# Patient Record
Sex: Female | Born: 1980
Health system: Southern US, Community
[De-identification: ages and names within clinical notes are randomized; demographics above are authoritative.]

## PROBLEM LIST (undated history)

## (undated) DIAGNOSIS — I10 Essential (primary) hypertension: Secondary | ICD-10-CM

## (undated) DIAGNOSIS — Z720 Tobacco use: Secondary | ICD-10-CM

## (undated) DIAGNOSIS — I509 Heart failure, unspecified: Secondary | ICD-10-CM

## (undated) HISTORY — DX: Heart failure, unspecified: I50.9

## (undated) HISTORY — DX: Essential (primary) hypertension: I10

## (undated) HISTORY — PX: TUBAL LIGATION: SHX77

## (undated) HISTORY — PX: CARDIAC CATHETERIZATION: SHX172

## (undated) HISTORY — PX: CHOLECYSTECTOMY: SHX55

---

## 2006-01-14 ENCOUNTER — Emergency Department (HOSPITAL_COMMUNITY): Admission: EM | Admit: 2006-01-14 | Discharge: 2006-01-14 | Payer: Self-pay | Admitting: Emergency Medicine

## 2006-02-19 ENCOUNTER — Emergency Department (HOSPITAL_COMMUNITY): Admission: EM | Admit: 2006-02-19 | Discharge: 2006-02-19 | Payer: Self-pay | Admitting: Emergency Medicine

## 2006-06-15 ENCOUNTER — Inpatient Hospital Stay (HOSPITAL_COMMUNITY): Admission: AD | Admit: 2006-06-15 | Discharge: 2006-06-15 | Payer: Self-pay | Admitting: Gynecology

## 2008-05-29 ENCOUNTER — Emergency Department (HOSPITAL_COMMUNITY): Admission: EM | Admit: 2008-05-29 | Discharge: 2008-05-29 | Payer: Self-pay | Admitting: Emergency Medicine

## 2016-09-21 ENCOUNTER — Encounter (INDEPENDENT_AMBULATORY_CARE_PROVIDER_SITE_OTHER): Payer: Self-pay

## 2020-02-29 ENCOUNTER — Encounter (HOSPITAL_COMMUNITY): Payer: Self-pay | Admitting: Emergency Medicine

## 2020-02-29 ENCOUNTER — Emergency Department (HOSPITAL_COMMUNITY)
Admission: EM | Admit: 2020-02-29 | Discharge: 2020-02-29 | Disposition: A | Discharge status: Home / Self Care | Payer: Self-pay | Attending: Emergency Medicine | Admitting: Emergency Medicine

## 2020-02-29 DIAGNOSIS — B86 Scabies: Secondary | ICD-10-CM | POA: Insufficient documentation

## 2020-02-29 DIAGNOSIS — R21 Rash and other nonspecific skin eruption: Secondary | ICD-10-CM

## 2020-02-29 MED ORDER — HYDROXYZINE HCL 25 MG PO TABS: 25.0000 mg | ORAL_TABLET | Freq: Four times a day (QID) | ORAL | 0 refills | Status: DC

## 2020-02-29 MED ORDER — PERMETHRIN 5 % EX CREA: TOPICAL_CREAM | CUTANEOUS | 1 refills | Status: DC

## 2020-02-29 NOTE — ED Triage Notes (Signed)
Patient in POV. Presents with rash and burning sensation to bilateral hands X1 weeks. States she has used hydrocortisone cream with no relief.

## 2020-02-29 NOTE — ED Provider Notes (Signed)
[facility] EMERGENCY DEPARTMENT Provider Note   CSN: [id number] Arrival date & time: 02/29/20  1711     History Chief Complaint  Patient presents with  . Rash    Isabella Sanders is a 39 y.o. female with no pertinent past medical history presents today for evaluation of pruritic rash on her bilateral hands and arms.  Rash has been present for about 1 week.  She has been trying hydrocortisone cream at home without significant relief.  She denies any fevers.  She states no one else at home has a similar rash.  She has never had a rash like this before.  She states that she has been exposed to new gloves and cleaners at work recently.  She states that it is more itchy at night.  She denies any fevers.  No known tick bites or other lesions.  She denies any new medications.  The rash itches the mostly between her fingers.  HPI     History reviewed. No pertinent past medical history.  There are no problems to display for this patient.   Past Surgical History:  Procedure Laterality Date  . CESAREAN SECTION       OB History   No obstetric history on file.     No family history on file.  Social History   Tobacco Use  . Smoking status: Never Smoker  . Smokeless tobacco: Never Used  Substance Use Topics  . Alcohol use: Yes    Comment: Socially   . Drug use: Not Currently    Home Medications Prior to Admission medications   Medication Sig Start Date End Date Taking? Authorizing Provider  hydrOXYzine (ATARAX/VISTARIL) 25 MG tablet Take 1 tablet (25 mg total) by mouth every 6 (six) hours. 02/29/20   Cristina Gong, PA-C  permethrin (ELIMITE) 5 % cream Apply to entire body from the neck down.  Leave on for 8-14 hours then wash off. Repeat in one week. 02/29/20   Cristina Gong, PA-C    Allergies    Patient has no known allergies.  Review of Systems   Review of Systems  Constitutional: Negative for chills and fever.  Respiratory: Negative for  shortness of breath.   Skin: Positive for rash. Negative for wound.  Allergic/Immunologic: Negative for immunocompromised state.  Neurological: Negative for weakness and headaches.  All other systems reviewed and are negative.   Physical Exam Updated Vital Signs BP (!) 157/109 (BP Location: Left Arm)   Pulse 86   Temp 98.8 F (37.1 C) (Oral)   Resp 15   Ht 5\' 6"  (1.676 m)   Wt 77.1 kg   SpO2 100%   BMI 27.44 kg/m   Physical Exam Vitals and nursing note reviewed.  Constitutional:      General: She is not in acute distress.    Appearance: She is not ill-appearing.  HENT:     Head: Normocephalic.  Cardiovascular:     Rate and Rhythm: Normal rate.  Pulmonary:     Effort: Pulmonary effort is normal. No respiratory distress.  Skin:    Comments: Extensive papular rash present over the bilateral hands with decreasing frequency up into the forearms.  There are multiple lesions in the intertriginous areas of the bilateral hands.  Secondary excoriation without evidence of secondary infection  Neurological:     Mental Status: She is alert.     Comments: Patient is awake and alert, answers questions appropriately without slurred speech or difficulty  ED Results / Procedures / Treatments   Labs (all labs ordered are listed, but only abnormal results are displayed) Labs Reviewed - No data to display  EKG None  Radiology No results found.  Procedures Procedures (including critical care time)  Medications Ordered in ED Medications - No data to display  ED Course  I have reviewed the triage vital signs and the nursing notes.  Pertinent labs & imaging results that were available during my care of the patient were reviewed by me and considered in my medical decision making (see chart for details).    MDM Rules/Calculators/A&P                     Patient is a 39 year old woman who presents today for evaluation of a pruritic rash on her bilateral hands now extending up  into her forearms worsening over the past week despite hydrocortisone ointment at home.  On exam she has multiple skin colored papules clustered primarily in in the intertriginous/webspaces of the fingers.  Clinically exam appears to be consistent with scabies infestation.  We discussed conservative care at home including the need to appropriately monitor and treat clothing, bed, furniture etc. and that any close contacts will also need to be treated for eradication of the mite.    She is given a prescription for permethrin cream with instructions for repeat dose in 1 week.  She is given a prescription for Atarax for itching.  Return precautions were discussed with patient who states their understanding.  At the time of discharge patient denied any unaddressed complaints or concerns.  Patient is agreeable for discharge home.  Note: Portions of this report may have been transcribed using voice recognition software. Every effort was made to ensure accuracy; however, inadvertent computerized transcription errors may be present  We discussed that scabies is very contagious and the need to ensure she does not infect others.   Final Clinical Impression(s) / ED Diagnoses Final diagnoses:  Rash and nonspecific skin eruption  Scabies    Rx / DC Orders ED Discharge Orders         Ordered    permethrin (ELIMITE) 5 % cream     02/29/20 1955    hydrOXYzine (ATARAX/VISTARIL) 25 MG tablet  Every 6 hours     02/29/20 1959           Ollen Gross 02/29/20 2125    Lennice Sites, DO 03/01/20 0010

## 2020-02-29 NOTE — Discharge Instructions (Signed)
Please use the prescription cream.  Please apply it to your entire body from the top of the neck down and leave on for 8 to 14 hours.  After that please shower and wash it off.  Recommend repeating this process 1 week after your first treatment.  If your rash does not start to improve in the next 5 days then you need to be reseen to evaluate for other causes.  Please stop using hydrocortisone cream.  I have given you a prescription for Atarax to help with itching.  This medicine can make you drowsy and you should not drive while taking it, please see full details below.  Additionally environmental control measures such as washing all bedding, sheets, and clothing in hot water is required to resolve a scabies infestation.  Any other household members or close contacts may also have scabies and may need to get treated.  The CDC website is a good resource for reference on scabies.  You are being prescribed a medication which may make you sleepy. For 24 hours after one dose please do not drive, operate heavy machinery, care for a small child with out another adult present, or perform any activities that may cause harm to you or someone else if you were to fall asleep or be impaired.   While in the ED your blood pressure was high.  Please follow up with your primary care doctor or the wellness clinic for repeat evaluation as you may need medication.  High blood pressure can cause long term, potentially serious, damage if left untreated.

## 2020-02-29 NOTE — ED Notes (Signed)
Pt verbalized understanding of discharge instructions. Follow up care, prescriptions reviewed. Pt ambulated independently to lobby.

## 2020-02-29 NOTE — ED Notes (Signed)
Pt reports rash to bilateral hands and forearms that started Monday on hands and has progressed up. States Tuesday it started tingling and yesterday it started burning. Pt used hydrocortisone cream yesterday, states it helped "stopped it spreading" but it still burns and itches.

## 2020-08-19 ENCOUNTER — Other Ambulatory Visit: Payer: Self-pay

## 2020-08-19 ENCOUNTER — Encounter (HOSPITAL_COMMUNITY): Payer: Self-pay | Admitting: Emergency Medicine

## 2020-08-19 ENCOUNTER — Inpatient Hospital Stay (HOSPITAL_COMMUNITY)
Admission: EM | Admit: 2020-08-19 | Discharge: 2020-08-23 | DRG: 286 | Disposition: A | Discharge status: Home / Self Care | Payer: Self-pay | Attending: Family Medicine | Admitting: Family Medicine

## 2020-08-19 DIAGNOSIS — I5040 Unspecified combined systolic (congestive) and diastolic (congestive) heart failure: Secondary | ICD-10-CM

## 2020-08-19 DIAGNOSIS — W19XXXA Unspecified fall, initial encounter: Secondary | ICD-10-CM

## 2020-08-19 DIAGNOSIS — K7460 Unspecified cirrhosis of liver: Secondary | ICD-10-CM

## 2020-08-19 DIAGNOSIS — R791 Abnormal coagulation profile: Secondary | ICD-10-CM | POA: Diagnosis present

## 2020-08-19 DIAGNOSIS — I447 Left bundle-branch block, unspecified: Secondary | ICD-10-CM | POA: Diagnosis present

## 2020-08-19 DIAGNOSIS — I428 Other cardiomyopathies: Secondary | ICD-10-CM | POA: Diagnosis present

## 2020-08-19 DIAGNOSIS — N920 Excessive and frequent menstruation with regular cycle: Secondary | ICD-10-CM | POA: Diagnosis present

## 2020-08-19 DIAGNOSIS — F17210 Nicotine dependence, cigarettes, uncomplicated: Secondary | ICD-10-CM | POA: Diagnosis present

## 2020-08-19 DIAGNOSIS — Z8241 Family history of sudden cardiac death: Secondary | ICD-10-CM

## 2020-08-19 DIAGNOSIS — Z20822 Contact with and (suspected) exposure to covid-19: Secondary | ICD-10-CM | POA: Diagnosis present

## 2020-08-19 DIAGNOSIS — Z8249 Family history of ischemic heart disease and other diseases of the circulatory system: Secondary | ICD-10-CM

## 2020-08-19 DIAGNOSIS — I10 Essential (primary) hypertension: Secondary | ICD-10-CM

## 2020-08-19 DIAGNOSIS — D500 Iron deficiency anemia secondary to blood loss (chronic): Secondary | ICD-10-CM | POA: Diagnosis present

## 2020-08-19 DIAGNOSIS — I509 Heart failure, unspecified: Secondary | ICD-10-CM

## 2020-08-19 DIAGNOSIS — M7989 Other specified soft tissue disorders: Secondary | ICD-10-CM

## 2020-08-19 DIAGNOSIS — D509 Iron deficiency anemia, unspecified: Secondary | ICD-10-CM | POA: Diagnosis present

## 2020-08-19 DIAGNOSIS — E785 Hyperlipidemia, unspecified: Secondary | ICD-10-CM | POA: Diagnosis present

## 2020-08-19 DIAGNOSIS — D649 Anemia, unspecified: Secondary | ICD-10-CM

## 2020-08-19 DIAGNOSIS — I5021 Acute systolic (congestive) heart failure: Secondary | ICD-10-CM

## 2020-08-19 DIAGNOSIS — I5041 Acute combined systolic (congestive) and diastolic (congestive) heart failure: Secondary | ICD-10-CM | POA: Diagnosis present

## 2020-08-19 DIAGNOSIS — E876 Hypokalemia: Secondary | ICD-10-CM | POA: Diagnosis present

## 2020-08-19 DIAGNOSIS — F1010 Alcohol abuse, uncomplicated: Secondary | ICD-10-CM | POA: Diagnosis present

## 2020-08-19 DIAGNOSIS — F1011 Alcohol abuse, in remission: Secondary | ICD-10-CM | POA: Diagnosis present

## 2020-08-19 DIAGNOSIS — Z832 Family history of diseases of the blood and blood-forming organs and certain disorders involving the immune mechanism: Secondary | ICD-10-CM

## 2020-08-19 DIAGNOSIS — Z720 Tobacco use: Secondary | ICD-10-CM | POA: Diagnosis present

## 2020-08-19 DIAGNOSIS — I110 Hypertensive heart disease with heart failure: Principal | ICD-10-CM | POA: Diagnosis present

## 2020-08-19 HISTORY — DX: Tobacco use: Z72.0

## 2020-08-19 LAB — CBC WITH DIFFERENTIAL/PLATELET
Hemoglobin: 7.7 g/dL — ABNORMAL LOW (ref 12.0–15.0)
Platelets: 352 10*3/uL (ref 150–400)
RBC: 3.79 MIL/uL — ABNORMAL LOW (ref 3.87–5.11)
WBC: 4.4 10*3/uL (ref 4.0–10.5)

## 2020-08-19 NOTE — ED Triage Notes (Signed)
Pt to ED with c/o bil feet and leg swelling xs 2 days.  C/O pain in ankles

## 2020-08-20 ENCOUNTER — Inpatient Hospital Stay (HOSPITAL_COMMUNITY): Payer: Self-pay

## 2020-08-20 ENCOUNTER — Encounter (HOSPITAL_COMMUNITY): Payer: Self-pay | Admitting: Internal Medicine

## 2020-08-20 ENCOUNTER — Emergency Department (HOSPITAL_COMMUNITY): Payer: Self-pay

## 2020-08-20 ENCOUNTER — Other Ambulatory Visit: Payer: Self-pay

## 2020-08-20 DIAGNOSIS — F1011 Alcohol abuse, in remission: Secondary | ICD-10-CM | POA: Diagnosis present

## 2020-08-20 DIAGNOSIS — I361 Nonrheumatic tricuspid (valve) insufficiency: Secondary | ICD-10-CM

## 2020-08-20 DIAGNOSIS — F1010 Alcohol abuse, uncomplicated: Secondary | ICD-10-CM | POA: Diagnosis present

## 2020-08-20 DIAGNOSIS — I340 Nonrheumatic mitral (valve) insufficiency: Secondary | ICD-10-CM

## 2020-08-20 DIAGNOSIS — R0602 Shortness of breath: Secondary | ICD-10-CM

## 2020-08-20 DIAGNOSIS — I5041 Acute combined systolic (congestive) and diastolic (congestive) heart failure: Secondary | ICD-10-CM

## 2020-08-20 DIAGNOSIS — I509 Heart failure, unspecified: Secondary | ICD-10-CM

## 2020-08-20 DIAGNOSIS — I5040 Unspecified combined systolic (congestive) and diastolic (congestive) heart failure: Secondary | ICD-10-CM

## 2020-08-20 DIAGNOSIS — Z720 Tobacco use: Secondary | ICD-10-CM

## 2020-08-20 DIAGNOSIS — D509 Iron deficiency anemia, unspecified: Secondary | ICD-10-CM | POA: Diagnosis present

## 2020-08-20 DIAGNOSIS — I10 Essential (primary) hypertension: Secondary | ICD-10-CM

## 2020-08-20 DIAGNOSIS — R609 Edema, unspecified: Secondary | ICD-10-CM

## 2020-08-20 DIAGNOSIS — E876 Hypokalemia: Secondary | ICD-10-CM

## 2020-08-20 LAB — IRON AND TIBC
Iron: 20 ug/dL — ABNORMAL LOW (ref 28–170)
Saturation Ratios: 5 % — ABNORMAL LOW (ref 10.4–31.8)
TIBC: 419 ug/dL (ref 250–450)
UIBC: 399 ug/dL

## 2020-08-20 LAB — ECHOCARDIOGRAM COMPLETE
Area-P 1/2: 4.18 cm2
Height: 66 in
MV M vel: 5.58 m/s
MV Peak grad: 124.5 mmHg
Radius: 0.5 cm
S' Lateral: 5.82 cm
Weight: 2848 oz

## 2020-08-20 LAB — COMPREHENSIVE METABOLIC PANEL
ALT: 24 U/L (ref 0–44)
AST: 24 U/L (ref 15–41)
Albumin: 3.1 g/dL — ABNORMAL LOW (ref 3.5–5.0)
Alkaline Phosphatase: 80 U/L (ref 38–126)
Anion gap: 7 (ref 5–15)
BUN: 11 mg/dL (ref 6–20)
CO2: 26 mmol/L (ref 22–32)
Calcium: 8.6 mg/dL — ABNORMAL LOW (ref 8.9–10.3)
Chloride: 104 mmol/L (ref 98–111)
Creatinine, Ser: 0.71 mg/dL (ref 0.44–1.00)
GFR, Estimated: >60 mL/min (ref >60)
Glucose, Bld: 119 mg/dL — ABNORMAL HIGH (ref 70–99)
Potassium: 3.3 mmol/L — ABNORMAL LOW (ref 3.5–5.1)
Sodium: 137 mmol/L (ref 135–145)
Total Bilirubin: 0.5 mg/dL (ref 0.3–1.2)
Total Protein: 6.1 g/dL — ABNORMAL LOW (ref 6.5–8.1)

## 2020-08-20 LAB — CBC WITH DIFFERENTIAL/PLATELET
Basophils Absolute: 0 10*3/uL (ref 0.0–0.1)
Basophils Relative: 1 %
Eosinophils Absolute: 0 10*3/uL (ref 0.0–0.5)
Eosinophils Relative: 1 %
HCT: 26.8 % — ABNORMAL LOW (ref 36.0–46.0)
Immature Granulocytes: 0 %
Lymphocytes Relative: 23 %
Lymphs Abs: 1 10*3/uL (ref 0.7–4.0)
MCH: 20.3 pg — ABNORMAL LOW (ref 26.0–34.0)
MCHC: 28.7 g/dL — ABNORMAL LOW (ref 30.0–36.0)
MCV: 70.7 fL — ABNORMAL LOW (ref 80.0–100.0)
Monocytes Absolute: 0.4 10*3/uL (ref 0.1–1.0)
Monocytes Relative: 10 %
Neutro Abs: 2.9 10*3/uL (ref 1.7–7.7)
Neutrophils Relative %: 65 %
RDW: 20.9 % — ABNORMAL HIGH (ref 11.5–15.5)
nRBC: 0 % (ref 0.0–0.2)

## 2020-08-20 LAB — TROPONIN I (HIGH SENSITIVITY)
Troponin I (High Sensitivity): 16 ng/L (ref <18)
Troponin I (High Sensitivity): 21 ng/L — ABNORMAL HIGH (ref <18)

## 2020-08-20 LAB — PROTIME-INR
INR: 1.3 — ABNORMAL HIGH (ref 0.8–1.2)
Prothrombin Time: 15.7 seconds — ABNORMAL HIGH (ref 11.4–15.2)

## 2020-08-20 LAB — TYPE AND SCREEN
ABO/RH(D): O POS
Antibody Screen: NEGATIVE

## 2020-08-20 LAB — RESPIRATORY PANEL BY RT PCR (FLU A&B, COVID)
Influenza A by PCR: NEGATIVE
Influenza B by PCR: NEGATIVE
SARS Coronavirus 2 by RT PCR: NEGATIVE

## 2020-08-20 LAB — TSH: TSH: 2.161 u[IU]/mL (ref 0.350–4.500)

## 2020-08-20 LAB — FERRITIN: Ferritin: 7 ng/mL — ABNORMAL LOW (ref 11–307)

## 2020-08-20 LAB — MAGNESIUM: Magnesium: 1.8 mg/dL (ref 1.7–2.4)

## 2020-08-20 LAB — HIV ANTIBODY (ROUTINE TESTING W REFLEX): HIV Screen 4th Generation wRfx: NONREACTIVE

## 2020-08-20 LAB — RETICULOCYTES
Immature Retic Fract: 21.4 % — ABNORMAL HIGH (ref 2.3–15.9)
RBC.: 3.8 MIL/uL — ABNORMAL LOW (ref 3.87–5.11)
Retic Count, Absolute: 66.9 10*3/uL (ref 19.0–186.0)
Retic Ct Pct: 1.8 % (ref 0.4–3.1)

## 2020-08-20 LAB — I-STAT BETA HCG BLOOD, ED (MC, WL, AP ONLY): I-stat hCG, quantitative: <5 m[IU]/mL (ref <5)

## 2020-08-20 LAB — FOLATE: Folate: 8.3 ng/mL (ref >5.9)

## 2020-08-20 LAB — VITAMIN B12: Vitamin B-12: 460 pg/mL (ref 180–914)

## 2020-08-20 LAB — ETHANOL: Alcohol, Ethyl (B): <10 mg/dL (ref <10)

## 2020-08-20 LAB — ABO/RH: ABO/RH(D): O POS

## 2020-08-20 LAB — BRAIN NATRIURETIC PEPTIDE: B Natriuretic Peptide: 2726.1 pg/mL — ABNORMAL HIGH (ref 0.0–100.0)

## 2020-08-20 MED ORDER — POTASSIUM CHLORIDE CRYS ER 20 MEQ PO TBCR
40.0000 meq | EXTENDED_RELEASE_TABLET | Freq: Once | ORAL | Status: AC
  Administered 2020-08-20: 40 meq via ORAL
  Filled 2020-08-20: qty 2

## 2020-08-20 MED ORDER — LORAZEPAM 1 MG PO TABS: 1.0000 mg | ORAL_TABLET | ORAL | Status: AC | PRN

## 2020-08-20 MED ORDER — LORAZEPAM 1 MG PO TABS: 0.0000 mg | ORAL_TABLET | Freq: Two times a day (BID) | ORAL | Status: DC

## 2020-08-20 MED ORDER — SACUBITRIL-VALSARTAN 49-51 MG PO TABS
1.0000 | ORAL_TABLET | Freq: Two times a day (BID) | ORAL | Status: DC
  Administered 2020-08-21 – 2020-08-23 (×5): 1 via ORAL
  Filled 2020-08-20 (×5): qty 1

## 2020-08-20 MED ORDER — FUROSEMIDE 10 MG/ML IJ SOLN
40.0000 mg | Freq: Two times a day (BID) | INTRAMUSCULAR | Status: DC
  Administered 2020-08-20: 40 mg via INTRAVENOUS
  Filled 2020-08-20: qty 4

## 2020-08-20 MED ORDER — FUROSEMIDE 10 MG/ML IJ SOLN
80.0000 mg | Freq: Two times a day (BID) | INTRAMUSCULAR | Status: DC
  Filled 2020-08-20: qty 8

## 2020-08-20 MED ORDER — CARVEDILOL 12.5 MG PO TABS
12.5000 mg | ORAL_TABLET | Freq: Two times a day (BID) | ORAL | Status: DC
  Administered 2020-08-20 – 2020-08-22 (×4): 12.5 mg via ORAL
  Filled 2020-08-20 (×4): qty 1

## 2020-08-20 MED ORDER — ADULT MULTIVITAMIN W/MINERALS CH
1.0000 | ORAL_TABLET | Freq: Every day | ORAL | Status: DC
  Administered 2020-08-20 – 2020-08-23 (×3): 1 via ORAL
  Filled 2020-08-20 (×3): qty 1

## 2020-08-20 MED ORDER — ENSURE ENLIVE PO LIQD
237.0000 mL | Freq: Two times a day (BID) | ORAL | Status: DC
  Administered 2020-08-21: 237 mL via ORAL

## 2020-08-20 MED ORDER — LORAZEPAM 1 MG PO TABS: 0.0000 mg | ORAL_TABLET | Freq: Four times a day (QID) | ORAL | Status: AC

## 2020-08-20 MED ORDER — POTASSIUM CHLORIDE CRYS ER 20 MEQ PO TBCR
40.0000 meq | EXTENDED_RELEASE_TABLET | Freq: Two times a day (BID) | ORAL | Status: DC
  Administered 2020-08-20 – 2020-08-22 (×5): 40 meq via ORAL
  Filled 2020-08-20 (×5): qty 2

## 2020-08-20 MED ORDER — THIAMINE HCL 100 MG PO TABS
100.0000 mg | ORAL_TABLET | Freq: Every day | ORAL | Status: DC
  Administered 2020-08-20 – 2020-08-23 (×3): 100 mg via ORAL
  Filled 2020-08-20 (×4): qty 1

## 2020-08-20 MED ORDER — SODIUM CHLORIDE 0.9% FLUSH
3.0000 mL | Freq: Two times a day (BID) | INTRAVENOUS | Status: DC
  Administered 2020-08-20 – 2020-08-21 (×2): 3 mL via INTRAVENOUS

## 2020-08-20 MED ORDER — FOLIC ACID 1 MG PO TABS
1.0000 mg | ORAL_TABLET | Freq: Every day | ORAL | Status: DC
  Administered 2020-08-20 – 2020-08-23 (×4): 1 mg via ORAL
  Filled 2020-08-20 (×4): qty 1

## 2020-08-20 MED ORDER — LOSARTAN POTASSIUM 50 MG PO TABS
50.0000 mg | ORAL_TABLET | Freq: Every day | ORAL | Status: DC
  Administered 2020-08-20: 50 mg via ORAL
  Filled 2020-08-20: qty 1

## 2020-08-20 MED ORDER — POTASSIUM CHLORIDE CRYS ER 20 MEQ PO TBCR
20.0000 meq | EXTENDED_RELEASE_TABLET | Freq: Once | ORAL | Status: AC
  Administered 2020-08-20: 20 meq via ORAL
  Filled 2020-08-20: qty 1

## 2020-08-20 MED ORDER — THIAMINE HCL 100 MG/ML IJ SOLN
100.0000 mg | Freq: Every day | INTRAMUSCULAR | Status: DC
  Administered 2020-08-21: 100 mg via INTRAVENOUS
  Filled 2020-08-20: qty 2

## 2020-08-20 MED ORDER — LORAZEPAM 2 MG/ML IJ SOLN: 1.0000 mg | INTRAMUSCULAR | Status: AC | PRN

## 2020-08-20 MED ORDER — HYDRALAZINE HCL 20 MG/ML IJ SOLN
10.0000 mg | INTRAMUSCULAR | Status: DC | PRN
  Filled 2020-08-20: qty 1

## 2020-08-20 NOTE — Plan of Care (Signed)
  Problem: Activity: Goal: Risk for activity intolerance will decrease Outcome: Progressing   Problem: Safety: Goal: Ability to remain free from injury will improve Outcome: Progressing   

## 2020-08-20 NOTE — H&P (Signed)
History and Physical    Isabella Sanders YOV:[id number] DOB: 04/03/1981 DOA: 08/19/2020  PCP: Patient, No Pcp Per  Patient coming from: Home.  Chief Complaint: Lower extremity edema.  HPI: Isabella Sanders is a 39 y.o. female with history of tobacco and alcohol abuse presents to the ER because of worsening lower extremity IMA noticed over the last couple of days.  Denies any shortness of breath but does have nonproductive cough which has been persistent for the last 2 weeks.  Has been taking over-the-counter Mucinex.  Denies fever chills nausea vomiting diarrhea admits to drinking alcohol every day also smokes tobacco.  Patient states her younger sister died at age 40 while in training in the [employer] from sudden cardiac death.  ED Course: In the ER on exam patient has 3+ lower extremity edema extending up to the thighs elevated JVD with chest x-ray showing marked enlarged cardiac silhouette concerning for pericardial effusion.  EKG shows LBBB with no old ones to compare.  Labs are significant for markedly elevated BNP of 2700 and with a microcytic hypochromic anemia with hemoglobin of 7.7 potassium of 3.3.  Covid test was negative.  Patient admitted for new onset CHF and further work-up for anemia.  Patient states she has heavy menstrual cycle except for the last month.  Denies noticed any blood in the stools.  Review of Systems: As per HPI, rest all negative.   History reviewed. No pertinent past medical history.  Past Surgical History:  Procedure Laterality Date  . CESAREAN SECTION    . CHOLECYSTECTOMY       reports that she has been smoking. She has never used smokeless tobacco. She reports current alcohol use. She reports previous drug use.  No Known Allergies  Family History  Problem Relation Age of Onset  . Sudden Cardiac Death Sister     Prior to Admission medications   Medication Sig Start Date End Date Taking? Authorizing Provider  guaiFENesin (MUCINEX) 600 MG 12 hr tablet  Take 600 mg by mouth 2 (two) times daily as needed.   Yes [provider]  naproxen sodium (ALEVE) 220 MG tablet Take 440 mg by mouth 2 (two) times daily as needed (pain).   Yes [provider]  hydrOXYzine (ATARAX/VISTARIL) 25 MG tablet Take 1 tablet (25 mg total) by mouth every 6 (six) hours. Patient not taking: Reported on 08/20/2020 02/29/20   Cristina Gong, PA-C    Physical Exam: Constitutional: Moderately built and nourished. Vitals:   08/20/20 0430 08/20/20 0500 08/20/20 0515 08/20/20 0529  BP: (!) 152/97 (!) 147/97 (!) 149/104 (!) 149/104  Pulse:    90  Resp: (!) 28 (!) 31 (!) 30 (!) 31  Temp:      TempSrc:      SpO2:    100%  Weight:      Height:       Eyes: Anicteric no pallor. ENMT: No discharge from the ears eyes nose or mouth. Neck: Elevated JVD no mass felt. Respiratory: No rhonchi or crepitations. Cardiovascular: S1-S2 heard. Abdomen: Soft nontender bowel sounds present. Musculoskeletal: Bilateral lower extremity edema extending up to thighs. Skin: Chronic skin changes patient states he was recently treated for scabies. Neurologic: Alert awake oriented to time place and person.  Moves all extremities. Psychiatric: Appears normal.  Normal affect.   Labs on Admission: I have personally reviewed following labs and imaging studies  CBC: Recent Labs  Lab 08/19/20 2327  WBC 4.4  NEUTROABS 2.9  HGB 7.7*  HCT 26.8*  MCV 70.7*  PLT 352   Basic Metabolic Panel: Recent Labs  Lab 08/19/20 2327  NA 137  K 3.3*  CL 104  CO2 26  GLUCOSE 119*  BUN 11  CREATININE 0.71  CALCIUM 8.6*   GFR: Estimated Creatinine Clearance: 100.2 mL/min (by C-G formula based on SCr of 0.71 mg/dL). Liver Function Tests: Recent Labs  Lab 08/19/20 2327  AST 24  ALT 24  ALKPHOS 80  BILITOT 0.5  PROT 6.1*  ALBUMIN 3.1*   No results for input(s): LIPASE, AMYLASE in the last 168 hours. No results for input(s): AMMONIA in the last 168  hours. Coagulation Profile: Recent Labs  Lab 08/20/20 0420  INR 1.3*   Cardiac Enzymes: No results for input(s): CKTOTAL, CKMB, CKMBINDEX, TROPONINI in the last 168 hours. BNP (last 3 results) No results for input(s): PROBNP in the last 8760 hours. HbA1C: No results for input(s): HGBA1C in the last 72 hours. CBG: No results for input(s): GLUCAP in the last 168 hours. Lipid Profile: No results for input(s): CHOL, HDL, LDLCALC, TRIG, CHOLHDL, LDLDIRECT in the last 72 hours. Thyroid Function Tests: No results for input(s): TSH, T4TOTAL, FREET4, T3FREE, THYROIDAB in the last 72 hours. Anemia Panel: No results for input(s): VITAMINB12, FOLATE, FERRITIN, TIBC, IRON, RETICCTPCT in the last 72 hours. Urine analysis: No results found for: COLORURINE, APPEARANCEUR, LABSPEC, PHURINE, GLUCOSEU, HGBUR, BILIRUBINUR, KETONESUR, PROTEINUR, UROBILINOGEN, NITRITE, LEUKOCYTESUR Sepsis Labs: @LABRCNTIP (procalcitonin:4,lacticidven:4) ) Recent Results (from the past 240 hour(s))  Respiratory Panel by RT PCR (Flu A&B, Covid) - Nasopharyngeal Swab     Status: None   Collection Time: 08/20/20  4:19 AM   Specimen: Nasopharyngeal Swab  Result Value Ref Range Status   SARS Coronavirus 2 by RT PCR NEGATIVE NEGATIVE Final    Comment: (NOTE) SARS-CoV-2 target nucleic acids are NOT DETECTED.  The SARS-CoV-2 RNA is generally detectable in upper respiratoy specimens during the acute phase of infection. The lowest concentration of SARS-CoV-2 viral copies this assay can detect is 131 copies/mL. A negative result does not preclude SARS-Cov-2 infection and should not be used as the sole basis for treatment or other patient management decisions. A negative result may occur with  improper specimen collection/handling, submission of specimen other than nasopharyngeal swab, presence of viral mutation(s) within the areas targeted by this assay, and inadequate number of viral copies (<131 copies/mL). A negative  result must be combined with clinical observations, patient history, and epidemiological information. The expected result is Negative.  Fact Sheet for Patients:  13/10/21  Fact Sheet for Healthcare Providers:  https://www.moore.com/  This test is no t yet approved or cleared by the https://www.young.biz/ FDA and  has been authorized for detection and/or diagnosis of SARS-CoV-2 by FDA under an Emergency Use Authorization (EUA). This EUA will remain  in effect (meaning this test can be used) for the duration of the COVID-19 declaration under Section 564(b)(1) of the Act, 21 U.S.C. section 360bbb-3(b)(1), unless the authorization is terminated or revoked sooner.     Influenza A by PCR NEGATIVE NEGATIVE Final   Influenza B by PCR NEGATIVE NEGATIVE Final    Comment: (NOTE) The Xpert Xpress SARS-CoV-2/FLU/RSV assay is intended as an aid in  the diagnosis of influenza from Nasopharyngeal swab specimens and  should not be used as a sole basis for treatment. Nasal washings and  aspirates are unacceptable for Xpert Xpress SARS-CoV-2/FLU/RSV  testing.  Fact Sheet for Patients: [place]  Fact Sheet for Healthcare Providers: https://www.moore.com/  This test is  not yet approved or cleared by the [place] and  has been authorized for detection and/or diagnosis of SARS-CoV-2 by  FDA under an Emergency Use Authorization (EUA). This EUA will remain  in effect (meaning this test can be used) for the duration of the  Covid-19 declaration under Section 564(b)(1) of the Act, 21  U.S.C. section 360bbb-3(b)(1), unless the authorization is  terminated or revoked. Performed at [facility], [address], [place], [place] [address]      Radiological Exams on Admission: DG Chest 2 View  Result Date: 08/20/2020 CLINICAL DATA:  Shortness of breath EXAM: CHEST - 2 VIEW COMPARISON:   None. FINDINGS: Marked cardiopericardial enlargement. Interstitial opacity with [employer]. No visible effusion or pneumothorax. Cholecystectomy clips. IMPRESSION: 1. CHF. 2. Marked cardiopericardial enlargement globular heart shape, pericardial effusion could be present. Electronically Signed   By: Marnee Spring M.D.   On: 08/20/2020 05:01    EKG: Independently reviewed.  Normal sinus rhythm LBBB.  Assessment/Plan Principal Problem:   Acute CHF (congestive heart failure) (HCC) Active Problems:   Microcytic hypochromic anemia   Alcohol abuse   Tobacco abuse    1. Acute CHF -patient symptoms are concerning for acute CHF.  Unknown EF.  2D echo has been ordered stat to check for any large pericardial effusion and also to assess the LV function.  Cardiology has been consulted.  At this time I have not placed patient on Lasix and to make sure there is no definite signs of any tamponade but clinically patient does not look to be in tamponade.  Follow intake output Daily weights and metabolic panel.  Check TSH.  Dopplers of the lower extremity. 2. Microcytic hypochromic anemia likely from menorrhagia.  Check anemia panel follow CBC.  Check stool for occult blood.  Likely contributing to patient's CHF. 3. Alcohol abuse could also be contributing to CHF.  On CIWA protocol advised by quitting. 4. Mildly elevated INR.  With history of alcohol abuse will check sonogram of the abdomen to make sure there is no cirrhosis. 5. Tobacco abuse advised about quitting. 6. Mild elevated blood pressure on admission.  Closely follow blood pressure trends.  As needed IV hydralazine for now. 7. History of sudden cardiac death in the family.  Patient's sister died at age 26.  Since patient has symptoms of CHF new onset with LBBB no old EKG to compare with markedly elevated cardiac silhouette will need close monitoring for any further worsening in inpatient status.   DVT prophylaxis: For now I kept patient on SCDs.   And to make sure there is no tamponade all aspects of effusion which may need procedure will avoid anticoagulation. Code Status: Full code. Family Communication: Patient's husband. Disposition Plan: Home. Consults called: Cardiology. Admission status: Inpatient.   Eduard Clos MD [facility] Pager [phone].  If 7PM-7AM, please contact night-coverage [email or web address] Password [email or web address]  08/20/2020, 6:20 AM

## 2020-08-20 NOTE — Progress Notes (Signed)
  ReDS Clip Diuretic Study Pt study # [id number]  Your patient has been enrolled in the ReDS Clip Diuretic Study  Has not received lasix yet - MD waiting for ECHO and cardiology input for rule out tamponade. SCr 0.71. No uop charted. Not on PTA diuretics.   Changes to prescribed diuretics recommended:  Start lasix 40 mg IV BID Provider contacted: Marjie Skiff, PA Recommendation was accepted by provider.    REDS Clip  READING= 51%  CHEST RULER = 31 Clip Station = C   Orthodema score = 2 Signs/Symptoms Score   Mild edema, no orthopnea 0 No congestion  Moderate edema, no orthopnea 1 Low-grade orthodema/congestion  Severe edema OR orthopnea 2   Moderate edema and orthopnea 3 High-grade orthodema/congestion  Severe edema AND orthopnea 4    Sharen Hones, PharmD, BCPS Phone [phone]  Please check [email or web address] for unit-specific pharmacist phone numbers

## 2020-08-20 NOTE — ED Provider Notes (Signed)
[facility] EMERGENCY DEPARTMENT Provider Note   CSN: [id number] Arrival date & time: 08/19/20  2259     History Chief Complaint  Patient presents with  . Bil Feet Swelling    Isabella Sanders is a 39 y.o. female.  HPI     This is a 39 year old female with no reported past medical history who presents with bilateral feet swelling.  Patient reports 2-day history of worsening leg and foot swelling.  She reports some tightness but no overt pain.  She has had some mild shortness of breath but no chest pain.  She states that time she has noticed that her abdomen will also swell and then improved.  No known history of heart failure or liver disease.  She does report that she drinks alcohol "every other day."  No known history of anemia.  She denies any recent fevers or illnesses.  She is fully vaccinated against COVID-19.  History reviewed. No pertinent past medical history.  Patient Active Problem List   Diagnosis Date Noted  . Acute CHF (congestive heart failure) (HCC) 08/20/2020  . Microcytic hypochromic anemia 08/20/2020  . Alcohol abuse 08/20/2020  . Tobacco abuse 08/20/2020    Past Surgical History:  Procedure Laterality Date  . CESAREAN SECTION    . CHOLECYSTECTOMY       OB History   No obstetric history on file.     Family History  Problem Relation Age of Onset  . Sudden Cardiac Death Sister     Social History   Tobacco Use  . Smoking status: Current Every Day Smoker  . Smokeless tobacco: Never Used  Substance Use Topics  . Alcohol use: Yes    Comment: everyday.  . Drug use: Not Currently    Home Medications Prior to Admission medications   Medication Sig Start Date End Date Taking? Authorizing Provider  guaiFENesin (MUCINEX) 600 MG 12 hr tablet Take 600 mg by mouth 2 (two) times daily as needed.   Yes [provider]  naproxen sodium (ALEVE) 220 MG tablet Take 440 mg by mouth 2 (two) times daily as needed (pain).   Yes  [provider]  hydrOXYzine (ATARAX/VISTARIL) 25 MG tablet Take 1 tablet (25 mg total) by mouth every 6 (six) hours. Patient not taking: Reported on 08/20/2020 02/29/20   Cristina Gong, PA-C    Allergies    Patient has no known allergies.  Review of Systems   Review of Systems  Constitutional: Negative for fever.  Respiratory: Positive for shortness of breath.   Cardiovascular: Positive for leg swelling. Negative for chest pain.  Gastrointestinal: Positive for abdominal distention. Negative for abdominal pain, nausea and vomiting.  Genitourinary: Positive for dysuria.  Neurological: Negative for numbness.  All other systems reviewed and are negative.   Physical Exam Updated Vital Signs BP (!) 149/104 (BP Location: Right Arm)   Pulse 90   Temp 98 F (36.7 C) (Oral)   Resp (!) 31   Ht 1.676 m (5\' 6" )   Wt 80.7 kg   LMP 07/19/2020   SpO2 100%   BMI 28.73 kg/m   Physical Exam Vitals and nursing note reviewed.  Constitutional:      Appearance: She is well-developed. She is not ill-appearing.  HENT:     Head: Normocephalic and atraumatic.     Nose: Nose normal.  Eyes:     Pupils: Pupils are equal, round, and reactive to light.     Comments: Slight yellowing of the eyes  Cardiovascular:     Rate and Rhythm: Normal rate and regular rhythm.     Heart sounds: Normal heart sounds.  Pulmonary:     Effort: Pulmonary effort is normal. No respiratory distress.     Breath sounds: No wheezing.  Abdominal:     General: Bowel sounds are normal.     Palpations: Abdomen is soft.     Tenderness: There is no abdominal tenderness. There is no guarding or rebound.  Musculoskeletal:     Cervical back: Neck supple.     Comments: 2+ pitting bilateral lower extremity edema to the knees  Skin:    General: Skin is warm and dry.  Neurological:     Mental Status: She is alert and oriented to person, place, and time.  Psychiatric:        Mood and Affect: Mood normal.      ED Results / Procedures / Treatments   Labs (all labs ordered are listed, but only abnormal results are displayed) Labs Reviewed  CBC WITH DIFFERENTIAL/PLATELET - Abnormal; Notable for the following components:      Result Value   RBC 3.79 (*)    Hemoglobin 7.7 (*)    HCT 26.8 (*)    MCV 70.7 (*)    MCH 20.3 (*)    MCHC 28.7 (*)    RDW 20.9 (*)    All other components within normal limits  COMPREHENSIVE METABOLIC PANEL - Abnormal; Notable for the following components:   Potassium 3.3 (*)    Glucose, Bld 119 (*)    Calcium 8.6 (*)    Total Protein 6.1 (*)    Albumin 3.1 (*)    All other components within normal limits  BRAIN NATRIURETIC PEPTIDE - Abnormal; Notable for the following components:   B Natriuretic Peptide 2,726.1 (*)    All other components within normal limits  PROTIME-INR - Abnormal; Notable for the following components:   Prothrombin Time 15.7 (*)    INR 1.3 (*)    All other components within normal limits  FERRITIN - Abnormal; Notable for the following components:   Ferritin 7 (*)    All other components within normal limits  RESPIRATORY PANEL BY RT PCR (FLU A&B, COVID)  ETHANOL  VITAMIN B12  TSH  IRON AND TIBC  FOLATE  HIV ANTIBODY (ROUTINE TESTING W REFLEX)  RAPID URINE DRUG SCREEN, HOSP PERFORMED  RETICULOCYTES  I-STAT BETA HCG BLOOD, ED ([facility], [facility], [facility] ONLY)  TYPE AND SCREEN    EKG EKG Interpretation  Date/Time:  Wednesday August 20 2020 04:21:21 EST Ventricular Rate:  85 PR Interval:    QRS Duration: 174 QT Interval:  454 QTC Calculation: 540 R Axis:   40 Text Interpretation: Sinus rhythm Left bundle branch block No prior for comparison Confirmed by Ross Marcus ([id number]) on 08/20/2020 5:40:58 AM   Radiology DG Chest 2 View  Result Date: 08/20/2020 CLINICAL DATA:  Shortness of breath EXAM: CHEST - 2 VIEW COMPARISON:  None. FINDINGS: Marked cardiopericardial enlargement. Interstitial opacity with [employer]. No visible effusion  or pneumothorax. Cholecystectomy clips. IMPRESSION: 1. CHF. 2. Marked cardiopericardial enlargement globular heart shape, pericardial effusion could be present. Electronically Signed   By: Marnee Spring M.D.   On: 08/20/2020 05:01   DG Pelvis 1-2 Views  Result Date: 08/20/2020 CLINICAL DATA:  Bilateral lower extremity swelling for 2 days. EXAM: PELVIS - 1-2 VIEW COMPARISON:  None. FINDINGS: Both hips are normally located. No significant degenerative changes or acute bony abnormality. No plain film  evidence of avascular necrosis. The pubic symphysis and SI joints are intact. No pelvic fractures or bone lesions. Age advanced vascular calcifications noted. IMPRESSION: No acute bony findings. Electronically Signed   By: Rudie Meyer M.D.   On: 08/20/2020 06:49    Procedures Procedures (including critical care time)  Medications Ordered in ED Medications  LORazepam (ATIVAN) tablet 1-4 mg (has no administration in time range)    Or  LORazepam (ATIVAN) injection 1-4 mg (has no administration in time range)  thiamine tablet 100 mg (has no administration in time range)    Or  thiamine (B-1) injection 100 mg (has no administration in time range)  folic acid (FOLVITE) tablet 1 mg (has no administration in time range)  multivitamin with minerals tablet 1 tablet (has no administration in time range)  LORazepam (ATIVAN) tablet 0-4 mg (0 mg Oral Not Given 08/20/20 0650)    Followed by  LORazepam (ATIVAN) tablet 0-4 mg (has no administration in time range)  hydrALAZINE (APRESOLINE) injection 10 mg (has no administration in time range)  potassium chloride SA (KLOR-CON) CR tablet 20 mEq (20 mEq Oral Given 08/20/20 3267)    ED Course  I have reviewed the triage vital signs and the nursing notes.  Pertinent labs & imaging results that were available during my care of the patient were reviewed by me and considered in my medical decision making (see chart for details).    MDM Rules/Calculators/A&P                           Patient presents with lower extremity swelling.  She is overall nontoxic but appears older than stated age and chronically ill-appearing although she endorses no prior medical history.  She does report some shortness of breath.  No history of heart failure.  Considerations include but not limited to, dependent edema, heart failure, liver or kidney failure, fluid shifts.  Labs obtained.  Labs notable for anemia with a hemoglobin of 7.7.  She does report heavy periods.  She is otherwise not dizzy or tachycardic.  Suspect chronic anemia.  EKG shows a left bundle branch block.  Do not have a prior for comparison.  She is not currently having any active chest pain and reports no heart history.  Chest x-ray is concerning for CHF with interstitial opacities and curly lines.  She also has a marked cardio pericardial enlargement concerning for possible effusion.  She is not in cardiac tamponade.  She will need an echo.  This is concerning for possible new onset heart failure especially given an elevated BNP of greater than 2000.  With her anemia, question high output heart failure.  Plan for admission to the hospitalist for echocardiogram, diuresis, and further work-up.  Final Clinical Impression(s) / ED Diagnoses Final diagnoses:  New onset of congestive heart failure (HCC)  Swelling of lower extremity  Anemia, unspecified type    Rx / DC Orders ED Discharge Orders    None       Samariah Hokenson, Mayer Masker, MD 08/20/20 [phone]

## 2020-08-20 NOTE — Progress Notes (Signed)
  Echocardiogram 2D Echocardiogram has been performed.  Isabella Sanders 08/20/2020, 9:12 AM

## 2020-08-20 NOTE — Progress Notes (Signed)
Lower extremity venous bilateral study completed.   Please see CV Proc for preliminary results.   Bravlio Luca, RDMS  

## 2020-08-20 NOTE — Progress Notes (Signed)
Heart Failure Stewardship Pharmacist Progress Note   PCP: Patient, No Pcp Per PCP-Cardiologist: No primary care provider on file.    HPI:  39 yo F with PMH of tobacco and alcohol abuse without any other known medical history. Does have remarkable family history of cardiac disease. She presented to the ED on 08/19/20 with LE edema, orthopnea, and shortness of breath (both at rest and on exertion). An ECHO was done on 08/20/20 and her LVEF is reduced to 25-30%.  Current HF Medications: Furosemide 80 mg IV BID Carvedilol 12.5 mg BID Losartan 50 mg daily  Prior to admission HF Medications: None  Pertinent Lab Values: . Serum creatinine 0.71, BUN 11, Potassium 3.3, Sodium 137, BNP 2726.1  Vital Signs: . Weight: 178 lbs (admission weight: 178 lbs) . Blood pressure: 160/100s  . Heart rate: 80-90s  . ReDS reading: 51%  Medication Assistance / Insurance Benefits Check: Does the patient have prescription insurance?  No Type of insurance plan: N/A  Does the patient qualify for medication assistance through manufacturers or grants?   Pending household income information . Eligible grants and/or patient assistance programs: [employer] . Medication assistance applications in progress: [employer]  . Medication assistance applications approved: none Approved medication assistance renewals will be completed by: [facility] Office  Outpatient Pharmacy:  Prior to admission outpatient pharmacy: [facility] Is the patient willing to use [facility] pharmacy at discharge? Yes Is the patient willing to transition their outpatient pharmacy to utilize a [facility] outpatient pharmacy?   Pending    Assessment: 1. Acute systolic CHF (EF 53-29%), due to unknown etiology - pending L/RHC. NYHA class III symptoms. - Continue furosemide 80 mg IV BID - Continue carvedilol 12.5 mg BID - Consider transitioning losartan to Entresto - can help enroll patient in manufacturers assistance since she is uninsured - Consider  starting spironolactone + SGLT2i prior to discharge   Plan: 1) Medication changes recommended at this time: - Stop losartan and start Entresto 49/51 mg BID  2) Patient assistance application(s): - Will begin Entresto patient assistance application through [employer]  3)  Education  - To be completed prior to discharge  Sharen Hones, PharmD, BCPS Heart Failure Engineer, building services Phone [phone]

## 2020-08-20 NOTE — Progress Notes (Signed)
Heart Failure Patient Advocate Encounter  Completed application for [employer] Patient Assistance Program sent in an effort to reduce the patient's out of pocket expense for Entresto to $0.    Application completed and faxed to [phone].   [employer] patient assistance phone number for follow up is [phone].   Application pending, will continue to follow.  Sharen Hones, PharmD, BCPS Heart Failure Stewardship Pharmacist Phone [phone]

## 2020-08-20 NOTE — Consult Note (Signed)
Cardiology Consultation:   Patient ID: Isabella Sanders MRN: [id number]; DOB: Mar 10, 1981  Admit date: 08/19/2020 Date of Consult: 08/20/2020  Primary Care Provider: Patient, No Pcp Per [facility] Cardiologist: New (Dr. Flora Lipps) [facility] Electrophysiologist:  None    Patient Profile:   Isabella Sanders is a 39 y.o. Sanders with a history of tobacco and alcohol abuse but no other known medical history (although she has not been seen by a medical provider in 17 years) who is being seen today for the evaluation of new onset CHF after presenting with significant lower extremity edema at the request of Dr. Toniann Fail.  History of Present Illness:   Isabella Sanders is a 39 year old Sanders with a history of tobacco and alcohol abuse but no other known medical history (although she has not been seen by a medical provider in 17 years). She does have a family history of heart disease - one sister had a MI in her 63's and another sister died at the age of 35 from sudden cardiac arrest while training in the [employer]. She has a 17 year smoking history and currently smokes 1/2 pack per day. She also reports alcohol use (1-2 beers every day but no liquor) and daily marijuana use.  Patient presented to the ED on 08/19/2020 for further evaluation of lower extremity edema. Patient states sudden onset of lower extremity edema 3 days ago that quickly progressed. However, she also notes shortness of breath both at rest and with exertion for 1 week and orthopnea and PND for 1 month. She states 1 month ago she was sleeping on 1-2 pillows and now she is sleeping on 6 pillows or sitting up. She denies any chest pain. She notes some palpitations at night when she wakes up feeling short of breath. She also notes brief episodes of lightheadedness but no dizziness or syncope. She describes early satiety but denies any abdominal pain, nausea, or vomiting. She reports she had a "chest cold" about 2 weeks ago with a productive  cough and post nasal drip. She still has a lingering cough. No other recent illnesses or fevers. She denies any abnormal bleeding in urine or stools - no hematochezia, melena, or hematuria. She does report heavy menstrual periods but states they have actually been light the past 2 months.  In the ED, patient hypertensive with BP as high as the 170's/100's-110's and mildly tachycardic. EKG showed normal sinus rhythm, rate 85 bpm, with LBBB (no prior tracings for comparison). BNP elevated at 2,726. Chest x-ray was consistent with CHF with marked cardiopericardial enlargement and interstitial opacity with Kerley lines. WBC 4.4, Hgb 7.7, Plts 352. Na 137, K 3.3, Glucose 119, BUN 11, Cr 0.71. Total Protein low at 6.1 and Albumin low at 3.1 but otherwise LFTs normal. Ethanol <10. Respiratory panel negative for COVID and influenza. Patient was admitted for further evaluation/management of new onset CHF and anemia. STAT Echo was ordered to rule out large pericardial effusion. Echo showed LVEF of 25-30% with global hypokinesis, grade III diastolic dysfunction, moderate to severe MR, biatrial enlargement, moderately elevated PASP, and small pericardial effusion with no signs of tamponade. Cardiology was consulted for further evaluation.   Past Medical History:  Diagnosis Date   Tobacco abuse     Past Surgical History:  Procedure Laterality Date   CESAREAN SECTION     CHOLECYSTECTOMY       Home Medications:  Prior to Admission medications   Medication Sig Start Date End Date Taking? Authorizing Provider  guaiFENesin (MUCINEX) 600 MG 12 hr tablet Take 600 mg by mouth 2 (two) times daily as needed.   Yes [provider]  naproxen sodium (ALEVE) 220 MG tablet Take 440 mg by mouth 2 (two) times daily as needed (pain).   Yes [provider]  hydrOXYzine (ATARAX/VISTARIL) 25 MG tablet Take 1 tablet (25 mg total) by mouth every 6 (six) hours. Patient not taking: Reported on 08/20/2020 02/29/20    Cristina Gong, PA-C    Inpatient Medications: Scheduled Meds:  carvedilol  12.5 mg Oral BID WC   folic acid  1 mg Oral Daily   [START ON 08/21/2020] furosemide  80 mg Intravenous BID   LORazepam  0-4 mg Oral Q6H   Followed by   Melene Muller ON 08/22/2020] LORazepam  0-4 mg Oral Q12H   losartan  50 mg Oral Daily   multivitamin with minerals  1 tablet Oral Daily   potassium chloride  40 mEq Oral BID   thiamine  100 mg Oral Daily   Or   thiamine  100 mg Intravenous Daily   Continuous Infusions:  PRN Meds: hydrALAZINE, LORazepam **OR** LORazepam  Allergies:   No Known Allergies  Social History:   Social History   Socioeconomic History   Marital status: Single    Spouse name: Not on file   Number of children: Not on file   Years of education: Not on file   Highest education level: Not on file  Occupational History   Not on file  Tobacco Use   Smoking status: Current Every Day Smoker   Smokeless tobacco: Never Used  Substance and Sexual Activity   Alcohol use: Yes    Comment: everyday.   Drug use: Not Currently   Sexual activity: Not on file  Other Topics Concern   Not on file  Social History Narrative   Not on file   Social Determinants of Health   Financial Resource Strain:    Difficulty of Paying Living Expenses: Not on file  Food Insecurity:    Worried About Running Out of Food in the Last Year: Not on file   Ran Out of Food in the Last Year: Not on file  Transportation Needs:    Lack of Transportation (Medical): Not on file   Lack of Transportation (Non-Medical): Not on file  Physical Activity:    Days of Exercise per Week: Not on file   Minutes of Exercise per Session: Not on file  Stress:    Feeling of Stress : Not on file  Social Connections:    Frequency of Communication with Friends and Family: Not on file   Frequency of Social Gatherings with Friends and Family: Not on file   Attends Religious Services: Not on  file   Active Member of Clubs or Organizations: Not on file   Attends Banker Meetings: Not on file   Marital Status: Not on file  Intimate Partner Violence:    Fear of Current or Ex-Partner: Not on file   Emotionally Abused: Not on file   Physically Abused: Not on file   Sexually Abused: Not on file    Family History:    Family History  Problem Relation Age of Onset   Sudden Cardiac Death Sister 84   Heart attack Sister        "mild" heart attack in her 30's     ROS:  Please see the history of present illness.  Review of Systems  Constitutional: Negative for fever.  HENT: Positive for congestion.   Respiratory: Positive for cough and shortness of breath. Negative for hemoptysis.   Cardiovascular: Positive for orthopnea, leg swelling and PND. Negative for chest pain.  Gastrointestinal: Negative for abdominal pain, blood in stool, melena, nausea and vomiting.  Genitourinary: Negative for hematuria.  Neurological: Negative for dizziness and loss of consciousness.  Endo/Heme/Allergies: Does not bruise/bleed easily.  Psychiatric/Behavioral: Positive for substance abuse (tobacco, alcohol, and marijuana).   Physical Exam/Data:   Vitals:   08/20/20 1002 08/20/20 1102 08/20/20 1200 08/20/20 1300  BP: (!) 159/107 (!) 160/104 (!) 154/104 (!) 156/111  Pulse: 87 86    Resp: (!) 21 (!) 25 (!) 35 (!) 24  Temp:      TempSrc:      SpO2: 99% 100% 100% 99%  Weight:      Height:       No intake or output data in the 24 hours ending 08/20/20 1400 Last 3 Weights 08/19/2020 02/29/2020  Weight (lbs) 178 lb 170 lb  Weight (kg) 80.74 kg 77.111 kg     Body mass index is 28.73 kg/m.  General: 39 y.o. Sanders resting comfortably in no acute distress.  HEENT: Normocephalic and atraumatic. Sclera clear. Neck: Supple. No carotid bruits. JVD elevated. Heart: RRR. Distinct S1 and S2. Murmur and gallop noted. Radial and distal pedal pulses 2+ and equal bilaterally. Lungs: No  increased work of breathing. Mild crackles noted in bases. Abdomen: Soft, non-distended, and non-tender to palpation. Bowel sounds present in all 4 quadrants.  MSK: Normal strength and tone for age. Extremities: 2-3+ pitting edema of bilateral lower extremities extending up to thighs.    Skin: Warm and dry. Neuro: Alert and oriented x3. No focal deficits. Psych: Normal affect. Responds appropriately.   EKG:  The EKG was personally reviewed and demonstrates:  Normal sinus rhythm, rate 85 bpm, with LBBB. Normal axis. No prior tracings for comparison.  Telemetry:  Telemetry was personally reviewed and demonstrates: Sinus rhythm with rates in the 80's to low 100's.  Relevant CV Studies:  Echocardiogram 08/20/2020: Impressions: 1. Left ventricular ejection fraction, by estimation, is 25 to 30%. The  left ventricle has severely decreased function. The left ventricle  demonstrates global hypokinesis. The left ventricular internal cavity size  was moderately dilated. Left  ventricular diastolic parameters are consistent with Grade III diastolic  dysfunction (restrictive). Elevated left atrial pressure.  2. Right ventricular systolic function is normal. The right ventricular  size is normal. There is moderately elevated pulmonary artery systolic  pressure. The estimated right ventricular systolic pressure is 54.9 mmHg.  3. Left atrial size was severely dilated.  4. Right atrial size was moderately dilated.  5. A small pericardial effusion is present. The pericardial effusion is  circumferential. There is no evidence of cardiac tamponade.  6. The mitral valve is normal in structure. Moderate to severe mitral  valve regurgitation. No evidence of mitral stenosis.  7. The aortic valve is normal in structure. Aortic valve regurgitation is  not visualized. No aortic stenosis is present.  8. The inferior vena cava is dilated in size with <50% respiratory  variability, suggesting right  atrial pressure of 15 mmHg.  Laboratory Data:  High Sensitivity Troponin:   Recent Labs  Lab 08/20/20 1155  TROPONINIHS 16     Chemistry Recent Labs  Lab 08/19/20 2327  NA 137  K 3.3*  CL 104  CO2 26  GLUCOSE 119*  BUN 11  CREATININE 0.71  CALCIUM 8.6*  GFRNONAA >60  ANIONGAP 7    Recent Labs  Lab 08/19/20 2327  PROT 6.1*  ALBUMIN 3.1*  AST 24  ALT 24  ALKPHOS 80  BILITOT 0.5   Hematology Recent Labs  Lab 08/19/20 2327 08/20/20 1224  WBC 4.4  --   RBC 3.79* 3.80*  HGB 7.7*  --   HCT 26.8*  --   MCV 70.7*  --   MCH 20.3*  --   MCHC 28.7*  --   RDW 20.9*  --   PLT 352  --    BNP Recent Labs  Lab 08/20/20 0420  BNP 2,726.1*    DDimer No results for input(s): DDIMER in the last 168 hours.   Radiology/Studies:  DG Chest 2 View  Result Date: 08/20/2020 CLINICAL DATA:  Shortness of breath EXAM: CHEST - 2 VIEW COMPARISON:  None. FINDINGS: Marked cardiopericardial enlargement. Interstitial opacity with [employer]. No visible effusion or pneumothorax. Cholecystectomy clips. IMPRESSION: 1. CHF. 2. Marked cardiopericardial enlargement globular heart shape, pericardial effusion could be present. Electronically Signed   By: Marnee Spring M.D.   On: 08/20/2020 05:01   DG Pelvis 1-2 Views  Result Date: 08/20/2020 CLINICAL DATA:  Bilateral lower extremity swelling for 2 days. EXAM: PELVIS - 1-2 VIEW COMPARISON:  None. FINDINGS: Both hips are normally located. No significant degenerative changes or acute bony abnormality. No plain film evidence of avascular necrosis. The pubic symphysis and SI joints are intact. No pelvic fractures or bone lesions. Age advanced vascular calcifications noted. IMPRESSION: No acute bony findings. Electronically Signed   By: Rudie Meyer M.D.   On: 08/20/2020 06:49   ECHOCARDIOGRAM COMPLETE  Result Date: 08/20/2020    ECHOCARDIOGRAM REPORT   Patient Name:   TENEA SENS Date of Exam: 08/20/2020 Medical Rec #:  [id number]         Height:       66.0 in Accession #:    [id number]       Weight:       178.0 lb Date of Birth:  06/06/1981       BSA:          1.903 m Patient Age:    39 years         BP:           158/97 mmHg Patient Gender: F                HR:           89 bpm. Exam Location:  Inpatient Procedure: 2D Echo, Cardiac Doppler and Color Doppler                       STAT ECHO Reported to: Dr Donato Schultz on 08/20/2020 9:11:00 AM. Indications:    Dyspnea 786.09 / R06.00  History:        Patient has no prior history of Echocardiogram examinations.  Sonographer:    Elmarie Shiley Dance Referring Phys: [id number] ARSHAD N KAKRAKANDY IMPRESSIONS  1. Left ventricular ejection fraction, by estimation, is 25 to 30%. The left ventricle has severely decreased function. The left ventricle demonstrates global hypokinesis. The left ventricular internal cavity size was moderately dilated. Left ventricular diastolic parameters are consistent with Grade III diastolic dysfunction (restrictive). Elevated left atrial pressure.  2. Right ventricular systolic function is normal. The right ventricular size is normal. There is moderately elevated pulmonary artery systolic pressure. The estimated right ventricular systolic pressure is 54.9 mmHg.  3. Left atrial size was severely dilated.  4. Right atrial size was moderately dilated.  5. A small pericardial effusion is present. The pericardial effusion is circumferential. There is no evidence of cardiac tamponade.  6. The mitral valve is normal in structure. Moderate to severe mitral valve regurgitation. No evidence of mitral stenosis.  7. The aortic valve is normal in structure. Aortic valve regurgitation is not visualized. No aortic stenosis is present.  8. The inferior vena cava is dilated in size with <50% respiratory variability, suggesting right atrial pressure of 15 mmHg. FINDINGS  Left Ventricle: Left ventricular ejection fraction, by estimation, is 25 to 30%. The left ventricle has severely decreased function.  The left ventricle demonstrates global hypokinesis. The left ventricular internal cavity size was moderately dilated. There is no left ventricular hypertrophy. Abnormal (paradoxical) septal motion, consistent with left bundle branch block. Left ventricular diastolic parameters are consistent with Grade III diastolic dysfunction (restrictive). Elevated left atrial pressure. Right Ventricle: The right ventricular size is normal. No increase in right ventricular wall thickness. Right ventricular systolic function is normal. There is moderately elevated pulmonary artery systolic pressure. The tricuspid regurgitant velocity is 3.16 m/s, and with an assumed right atrial pressure of 15 mmHg, the estimated right ventricular systolic pressure is 54.9 mmHg. Left Atrium: Left atrial size was severely dilated. Right Atrium: Right atrial size was moderately dilated. Pericardium: A small pericardial effusion is present. The pericardial effusion is circumferential. There is no evidence of cardiac tamponade. Mitral Valve: The mitral valve is normal in structure. There is mild thickening of the mitral valve leaflet(s). There is mild calcification of the mitral valve leaflet(s). Moderate to severe mitral valve regurgitation, with eccentric laterally directed jet. No evidence of mitral valve stenosis. Tricuspid Valve: The tricuspid valve is normal in structure. Tricuspid valve regurgitation is mild . No evidence of tricuspid stenosis. Aortic Valve: The aortic valve is normal in structure. Aortic valve regurgitation is not visualized. No aortic stenosis is present. Pulmonic Valve: The pulmonic valve was normal in structure. Pulmonic valve regurgitation is mild to moderate. No evidence of pulmonic stenosis. Aorta: The aortic root is normal in size and structure. Venous: The inferior vena cava is dilated in size with less than 50% respiratory variability, suggesting right atrial pressure of 15 mmHg. IAS/Shunts: No atrial level shunt  detected by color flow Doppler.  LEFT VENTRICLE PLAX 2D LVIDd:         6.78 cm  Diastology LVIDs:         5.82 cm  LV e' medial:    4.03 cm/s LV PW:         1.29 cm  LV E/e' medial:  40.7 LV IVS:        0.79 cm  LV e' lateral:   8.38 cm/s LVOT diam:     2.00 cm  LV E/e' lateral: 19.6 LV SV:         34 LV SV Index:   18 LVOT Area:     3.14 cm  RIGHT VENTRICLE            IVC RV Basal diam:  4.15 cm    IVC diam: 2.79 cm RV Mid diam:    2.47 cm RV S prime:     8.92 cm/s TAPSE (M-mode): 2.1 cm LEFT ATRIUM              Index       RIGHT ATRIUM           Index LA diam:  4.70 cm  2.47 cm/m  RA Area:     30.20 cm LA Vol (A2C):   119.0 ml 62.53 ml/m RA Volume:   102.00 ml 53.59 ml/m LA Vol (A4C):   108.0 ml 56.75 ml/m LA Biplane Vol: 114.0 ml 59.90 ml/m  AORTIC VALVE LVOT Vmax:   82.10 cm/s LVOT Vmean:  47.900 cm/s LVOT VTI:    0.109 m  AORTA Ao Root diam: 3.00 cm Ao Asc diam:  3.30 cm MITRAL VALVE                 TRICUSPID VALVE MV Area (PHT): 4.18 cm      TR Peak grad:   39.9 mmHg MV Decel Time: 182 msec      TR Vmax:        316.00 cm/s MR Peak grad:    124.5 mmHg MR Mean grad:    78.0 mmHg   SHUNTS MR Vmax:         558.00 cm/s Systemic VTI:  0.11 m MR Vmean:        412.0 cm/s  Systemic Diam: 2.00 cm MR PISA:         1.57 cm MR PISA Eff ROA: 11 mm MR PISA Radius:  0.50 cm MV E velocity: 164.00 cm/s Donato Schultz MD Electronically signed by Donato Schultz MD Signature Date/Time: 08/20/2020/10:23:07 AM    Final    VAS [place] LOWER EXTREMITY VENOUS (DVT)  Result Date: 08/20/2020  Lower Venous DVT Study Indications: Edema.  Comparison Study: No prior studies. Performing Technologist: Jean Rosenthal, RDMS  Examination Guidelines: A complete evaluation includes B-mode imaging, spectral Doppler, color Doppler, and power Doppler as needed of all accessible portions of each vessel. Bilateral testing is considered an integral part of a complete examination. Limited examinations for reoccurring indications may be performed  as noted. The reflux portion of the exam is performed with the patient in reverse Trendelenburg.  +---------+---------------+---------+-----------+----------+--------------+  RIGHT     Compressibility Phasicity Spontaneity Properties Thrombus Aging  +---------+---------------+---------+-----------+----------+--------------+  CFV       Full            Yes       Yes                                    +---------+---------------+---------+-----------+----------+--------------+  SFJ       Full                                                             +---------+---------------+---------+-----------+----------+--------------+  FV Prox   Full                                                             +---------+---------------+---------+-----------+----------+--------------+  FV Mid    Full                                                             +---------+---------------+---------+-----------+----------+--------------+  FV Distal Full                                                             +---------+---------------+---------+-----------+----------+--------------+  PFV       Full                                                             +---------+---------------+---------+-----------+----------+--------------+  POP       Full            Yes       Yes                                    +---------+---------------+---------+-----------+----------+--------------+  PTV       Full                                                             +---------+---------------+---------+-----------+----------+--------------+  PERO      Full                                                             +---------+---------------+---------+-----------+----------+--------------+   +---------+---------------+---------+-----------+----------+--------------+  LEFT      Compressibility Phasicity Spontaneity Properties Thrombus Aging  +---------+---------------+---------+-----------+----------+--------------+  CFV       Full             Yes       Yes                                    +---------+---------------+---------+-----------+----------+--------------+  SFJ       Full                                                             +---------+---------------+---------+-----------+----------+--------------+  FV Prox   Full                                                             +---------+---------------+---------+-----------+----------+--------------+  FV Mid    Full                                                             +---------+---------------+---------+-----------+----------+--------------+  FV Distal Full                                                             +---------+---------------+---------+-----------+----------+--------------+  PFV       Full                                                             +---------+---------------+---------+-----------+----------+--------------+  POP       Full            Yes       Yes                                    +---------+---------------+---------+-----------+----------+--------------+  PTV       Full                                                             +---------+---------------+---------+-----------+----------+--------------+  PERO      Full                                                             +---------+---------------+---------+-----------+----------+--------------+     Summary: RIGHT: - There is no evidence of deep vein thrombosis in the lower extremity.  - No cystic structure found in the popliteal fossa.  LEFT: - There is no evidence of deep vein thrombosis in the lower extremity.  - No cystic structure found in the popliteal fossa.  *See table(s) above for measurements and observations.    Preliminary    US Abdomen Limited RUQ (LIVER/GB)  Result Date: 08/20/2020 CLINICAL DATA:  Cirrhosis EXAM: ULTRASOUND ABDOMEN LIMITED RIGHT UPPER QUADRANT COMPARISON:  None. FINDINGS: Gallbladder: Cholecystectomy. Common bile duct: Diameter: 4.1 mm, within normal  limits. Liver: No focal lesion identified. Within normal limits in parenchymal echogenicity. Portal vein is patent on color Doppler imaging with normal direction of blood flow towards the liver. IMPRESSION: No acute abnormality. Electronically Signed   By: Feliberto Harts MD   On: 08/20/2020 07:54     Assessment and Plan:   New Onset Acute Combined CHF - Patient presented with dyspnea at rest and exertion, orthopnea, PND, and lower extremity edema. - BNP elevated 2,726. - Chest x-ray consistent with CHF with marked cardiopericardial enlargement and interstitial opacity with Kerley lines. - Echo showed LVEF of 25-30% with global hypokinesis, grade III diastolic dysfunction, moderate to severe MR, biatrial enlargement, moderately elevated PASP, and small pericardial effusion with no signs of tamponade.  - TSH normal. - HIV non-reactive. - Patient grossly volume overloaded on exam. - Will start IV Lasix 80mg  twice daily. She has already received one dose of IV 40mg  in  the ED. - Would be a great Entresto candidate but patient does not have insurance. Will start Losartan 50mg  daily for now. - No signs of low output at this time so will start Coreg 12.5mg  twice daily. - Will likely start Spironolactone tomorrow if BP and renal function allow. - Monitor daily weights, strict I/O's, and renal function. - Suspect hypertensive cardiomyopathy. However, given family history of cardiac arrest and MI at young age, think we should exclude ischemia with right/left heart catheterization after patient has been adequately diuresed (Friday at the earliest, possibly Monday).   Hypertension - BP as high as the 170's/110's in the ED. - Will start Losartan and Coreg as above.  Hypokalemia - Potassium 3.3 on admission. Supplemented. - Will start K-Dur 40 mEq twice daily while be are aggressively diuresing her.  - Will need daily BMET.  Microcystic Anemia - Hemoglobin 7.7 on admission. - Iron and ferritin  low. - Vitamin B12 and folate normal. - Hemoccult pending. - Management per primary team.  Polysubstance Abuse (Tobacco, Alcohol, Marijuana) - Patient has a 17 year smoking history and currently smokes 1/2 pack per day.  - She drinks 1-2 beer every night. Denies any use of hard liquor.  - She also notes daily marijuana use. - Urine durg screen pending. - Will need to continue to discuss importance of cessation.  New York Heart Association (NYHA) Functional Class NYHA Class IV     For questions or updates, please contact [facility] Please consult [email or web address] for contact info under    Signed, Wednesday, PA-C  08/20/2020 2:00 PM

## 2020-08-20 NOTE — Progress Notes (Addendum)
Patient seen and examined at bedside, patient admitted after midnight, please see earlier detailed admission note by Eduard Clos, MD. Briefly, patient presented with bilateral lower extremity edema with concern for acute heart failure. Transthoracic Echocardiogram obtained and significant for reduced EF of 25%. EKG with LBBB. Patient is a mild alcohol drinker but no risk factors for alcohol abuse. It appears she has likely chronic, untreated hypertension.  BP (!) 154/104   Pulse 86   Temp 98.5 F (36.9 C) (Oral)   Resp (!) 35   Ht 5\' 6"  (1.676 m)   Wt 80.7 kg   LMP 07/19/2020   SpO2 100%   BMI 28.73 kg/m   General exam: Appears calm and comfortable Respiratory system: Clear to auscultation. Respiratory effort normal. Cardiovascular system: S1 & S2 heard, RRR. No murmurs, rubs, gallops or clicks. Gastrointestinal system: Abdomen is nondistended, soft and nontender. No organomegaly or masses felt. Normal bowel sounds heard. Central nervous system: Alert and oriented. No focal neurological deficits. Musculoskeletal: Bilateral LE edema with some pitting. No calf tenderness Skin: No cyanosis. No rashes Psychiatry: Judgement and insight appear normal. Mood & affect appropriate.   A/P:  Acute combined systolic and diastolic heart failure Unknown etiology but possibly secondary to long-standing hypertension vs acute coronary event. -Troponin -Cardiology recommendations -No lasix initiated on admission. No respiratory symptoms so can likely start oral regimen; will defer to cardiology -Telemetry  Hypertension Elevated today. -Losartan 50 mg daily  LE edema Secondary to heart failure  Tobacco/alcohol use Counseled to quit. Patient seems agreeable. Husband does not drink alcohol anymore so hopefully this will be an easy transition.  Iron deficiency anemia Menstruating female. No PCP follow-up in the past. Does not appear to be acute secondary to lack of symptomatology.  Reticulocyte count is normal which would be consistent with iron defiency -IV iron prior to discharge -FOBT ordered on admission and pending -Blood smear -Addendum: discussed with cardiology and they plan for cath. In this setting, will discuss with GI for possible GI evaluation in case patient ends up needing PCI with her history of anemia of unknown etiology  Disposition: Likely discharge in 24-48 hours depending on workup for heart failure inpatient in addition to medication optimization. Patient has no insurance; CM consult for meds and PCP.   08-29-1981, MD [facility] 08/20/2020, 12:35 PM

## 2020-08-21 DIAGNOSIS — I5021 Acute systolic (congestive) heart failure: Secondary | ICD-10-CM

## 2020-08-21 LAB — LIPID PANEL
Cholesterol: 122 mg/dL (ref 0–200)
HDL: 32 mg/dL — ABNORMAL LOW (ref >40)
LDL Cholesterol: 82 mg/dL (ref 0–99)
Total CHOL/HDL Ratio: 3.8 RATIO
Triglycerides: 40 mg/dL (ref <150)
VLDL: 8 mg/dL (ref 0–40)

## 2020-08-21 LAB — RAPID URINE DRUG SCREEN, HOSP PERFORMED
Amphetamines: NOT DETECTED
Barbiturates: NOT DETECTED
Benzodiazepines: NOT DETECTED
Cocaine: NOT DETECTED
Opiates: NOT DETECTED
Tetrahydrocannabinol: POSITIVE — AB

## 2020-08-21 LAB — CBC
HCT: 28.6 % — ABNORMAL LOW (ref 36.0–46.0)
Hemoglobin: 8.3 g/dL — ABNORMAL LOW (ref 12.0–15.0)
MCH: 20.2 pg — ABNORMAL LOW (ref 26.0–34.0)
MCHC: 29 g/dL — ABNORMAL LOW (ref 30.0–36.0)
MCV: 69.8 fL — ABNORMAL LOW (ref 80.0–100.0)
Platelets: 356 10*3/uL (ref 150–400)
RBC: 4.1 MIL/uL (ref 3.87–5.11)
RDW: 20.7 % — ABNORMAL HIGH (ref 11.5–15.5)
WBC: 4.3 10*3/uL (ref 4.0–10.5)
nRBC: 0 % (ref 0.0–0.2)

## 2020-08-21 LAB — COMPREHENSIVE METABOLIC PANEL
ALT: 27 U/L (ref 0–44)
AST: 28 U/L (ref 15–41)
Albumin: 3.4 g/dL — ABNORMAL LOW (ref 3.5–5.0)
Alkaline Phosphatase: 88 U/L (ref 38–126)
Anion gap: 9 (ref 5–15)
BUN: 13 mg/dL (ref 6–20)
CO2: 25 mmol/L (ref 22–32)
Calcium: 8.9 mg/dL (ref 8.9–10.3)
Chloride: 104 mmol/L (ref 98–111)
Creatinine, Ser: 0.73 mg/dL (ref 0.44–1.00)
GFR, Estimated: >60 mL/min (ref >60)
Glucose, Bld: 104 mg/dL — ABNORMAL HIGH (ref 70–99)
Potassium: 3.9 mmol/L (ref 3.5–5.1)
Sodium: 138 mmol/L (ref 135–145)
Total Bilirubin: 0.6 mg/dL (ref 0.3–1.2)
Total Protein: 6.5 g/dL (ref 6.5–8.1)

## 2020-08-21 LAB — HEMOGLOBIN A1C
Hgb A1c MFr Bld: 5.3 % (ref 4.8–5.6)
Mean Plasma Glucose: 105.41 mg/dL

## 2020-08-21 LAB — LACTATE DEHYDROGENASE: LDH: 148 U/L (ref 98–192)

## 2020-08-21 LAB — PHOSPHORUS: Phosphorus: 3.9 mg/dL (ref 2.5–4.6)

## 2020-08-21 LAB — PATHOLOGIST SMEAR REVIEW

## 2020-08-21 LAB — MAGNESIUM: Magnesium: 1.9 mg/dL (ref 1.7–2.4)

## 2020-08-21 MED ORDER — SODIUM CHLORIDE 0.9% FLUSH
3.0000 mL | Freq: Two times a day (BID) | INTRAVENOUS | Status: DC
  Administered 2020-08-21 – 2020-08-22 (×2): 3 mL via INTRAVENOUS

## 2020-08-21 MED ORDER — ENSURE ENLIVE PO LIQD: 237.0000 mL | ORAL | Status: DC

## 2020-08-21 MED ORDER — FUROSEMIDE 10 MG/ML IJ SOLN
40.0000 mg | Freq: Two times a day (BID) | INTRAMUSCULAR | Status: DC
  Administered 2020-08-21: 40 mg via INTRAVENOUS

## 2020-08-21 MED ORDER — SPIRONOLACTONE 25 MG PO TABS
25.0000 mg | ORAL_TABLET | Freq: Every day | ORAL | Status: DC
  Administered 2020-08-21 – 2020-08-22 (×2): 25 mg via ORAL
  Filled 2020-08-21 (×3): qty 1

## 2020-08-21 MED ORDER — ASPIRIN 81 MG PO CHEW
81.0000 mg | CHEWABLE_TABLET | ORAL | Status: AC
  Administered 2020-08-22: 81 mg via ORAL
  Filled 2020-08-21: qty 1

## 2020-08-21 MED ORDER — SODIUM CHLORIDE 0.9 % IV SOLN: INTRAVENOUS | Status: DC

## 2020-08-21 MED ORDER — SODIUM CHLORIDE 0.9% FLUSH: 3.0000 mL | INTRAVENOUS | Status: DC | PRN

## 2020-08-21 MED ORDER — SODIUM CHLORIDE 0.9 % IV SOLN: 250.0000 mL | INTRAVENOUS | Status: DC | PRN

## 2020-08-21 MED ORDER — FUROSEMIDE 10 MG/ML IJ SOLN
60.0000 mg | Freq: Two times a day (BID) | INTRAMUSCULAR | Status: DC
  Administered 2020-08-21 – 2020-08-23 (×4): 60 mg via INTRAVENOUS
  Filled 2020-08-21 (×6): qty 6

## 2020-08-21 NOTE — Progress Notes (Signed)
PROGRESS NOTE    Isabella Sanders  AVW:[id number] DOB: [birth year] DOA: 08/19/2020 PCP: Patient, No Pcp Per   Brief Narrative: Isabella Sanders is a 39 y.o. female with a history of tobacco and alcohol use. She presented secondary to bilateral leg swelling and found to have acute systolic heart failure. Lasix IV started. Cardiology consulted for management.   Assessment & Plan:   Principal Problem:   Acute CHF (congestive heart failure) (HCC) Active Problems:   Microcytic hypochromic anemia   Alcohol abuse   Tobacco abuse   Hypertension   Acute systolic heart failure (HCC)   Acute combined systolic and diastolic heart failure Unknown etiology but possibly secondary to long-standing hypertension vs acute coronary event. No recent stressors. Cardiology consulted and plan heart catheterization. Weight on admission of 80.7 kg. Weight of 77 kg today. UOP of only 450 mL over the last 24 hours which is likely inaccurate. -Cardiology recommendations: heart catheterization on 11/12, Lasix IV, Entresto/Coreg/Lasaix/Spironolactone -Telemetry  Hypertension Likely long-standing. Started on Entresto, Coreg, Lasix and spironolactone for above. Blood pressure better controlled today -Continue Entresto, Coreg, spironolactone/lasix  LE edema Secondary to heart failure. Some improvement since treating heart failure with Lasix -Elevation when sitting/lying down -Lasix per cardiology  Tobacco/alcohol use Counseled to quit. Patient seems agreeable. Husband does not drink alcohol anymore so hopefully this will be an easy transition.  Iron deficiency anemia Menstruating female; has a history of menorrhagia in the past but not currently. No PCP follow-up in the past. Does not appear to be acute secondary to lack of symptomatology. Reticulocyte count is normal which would be consistent with iron deficiency. Family history of sickle cell disease/trait. Vitamin B12/folate normal. -IV iron prior  to discharge -FOBT ordered on admission and pending -Blood smear pending -GI planning outpatient workup as needed   DVT prophylaxis: SCDs Code Status:   Code Status: Full Code Family Communication: Husband at bedside Disposition Plan: Discharge home pending cardiology recommendations   Consultants:   Cardiology  Gastroenterology  Procedures:   TRANSTHORACIC ECHOCARDIOGRAM (08/20/2020) IMPRESSIONS    1. Left ventricular ejection fraction, by estimation, is 25 to 30%. The  left ventricle has severely decreased function. The left ventricle  demonstrates global hypokinesis. The left ventricular internal cavity size  was moderately dilated. Left  ventricular diastolic parameters are consistent with Grade III diastolic  dysfunction (restrictive). Elevated left atrial pressure.  2. Right ventricular systolic function is normal. The right ventricular  size is normal. There is moderately elevated pulmonary artery systolic  pressure. The estimated right ventricular systolic pressure is 54.9 mmHg.  3. Left atrial size was severely dilated.  4. Right atrial size was moderately dilated.  5. A small pericardial effusion is present. The pericardial effusion is  circumferential. There is no evidence of cardiac tamponade.  6. The mitral valve is normal in structure. Moderate to severe mitral  valve regurgitation. No evidence of mitral stenosis.  7. The aortic valve is normal in structure. Aortic valve regurgitation is  not visualized. No aortic stenosis is present.  8. The inferior vena cava is dilated in size with <50% respiratory  variability, suggesting right atrial pressure of 15 mmHg.   Antimicrobials:  None    Subjective: Feels the swelling has gone down. No chest pain or dyspnea.  Objective: Vitals:   08/21/20 0134 08/21/20 0420 08/21/20 0709 08/21/20 1153  BP:  (!) 150/96 (!) 150/101 128/84  Pulse:  87 83 80  Resp:  Temp:  98.6  F (37 C) 98 F (36.7  C) 98.4 F (36.9 C)  TempSrc:  Oral Oral Oral  SpO2:  100% 99% 100%  Weight: 77 kg     Height:        Intake/Output Summary (Last 24 hours) at 08/21/2020 1211 Last data filed at 08/21/2020 0901 Gross per 24 hour  Intake 250 ml  Output 450 ml  Net -200 ml   Filed Weights   08/20/20 1758 08/21/20 0030 08/21/20 0134  Weight: 76.5 kg 77 kg 77 kg    Examination:  General exam: Appears calm and comfortable Respiratory system: Clear to auscultation. Respiratory effort normal. Cardiovascular system: S1 & S2 heard, RRR. No murmurs, rubs, gallops or clicks. Gastrointestinal system: Abdomen is nondistended, soft and nontender. No organomegaly or masses felt. Normal bowel sounds heard. Central nervous system: Alert and oriented. No focal neurological deficits. Musculoskeletal: BLE edema. No calf tenderness Skin: No cyanosis. No rashes Psychiatry: Judgement and insight appear normal. Mood & affect appropriate.     Data Reviewed: I have personally reviewed following labs and imaging studies  CBC Lab Results  Component Value Date   WBC 4.3 08/21/2020   RBC 4.10 08/21/2020   HGB 8.3 (L) 08/21/2020   HCT 28.6 (L) 08/21/2020   MCV 69.8 (L) 08/21/2020   MCH 20.2 (L) 08/21/2020   PLT 356 08/21/2020   MCHC 29.0 (L) 08/21/2020   RDW 20.7 (H) 08/21/2020   LYMPHSABS 1.0 08/19/2020   MONOABS 0.4 08/19/2020   EOSABS 0.0 08/19/2020   BASOSABS 0.0 08/19/2020     Last metabolic panel Lab Results  Component Value Date   NA 138 08/21/2020   K 3.9 08/21/2020   CL 104 08/21/2020   CO2 25 08/21/2020   BUN 13 08/21/2020   CREATININE 0.73 08/21/2020   GLUCOSE 104 (H) 08/21/2020   GFRNONAA >60 08/21/2020   CALCIUM 8.9 08/21/2020   PHOS 3.9 08/21/2020   PROT 6.5 08/21/2020   ALBUMIN 3.4 (L) 08/21/2020   BILITOT 0.6 08/21/2020   ALKPHOS 88 08/21/2020   AST 28 08/21/2020   ALT 27 08/21/2020   ANIONGAP 9 08/21/2020    CBG (last 3)  No results for input(s): GLUCAP in the last 72  hours.   GFR: Estimated Creatinine Clearance: 102 mL/min (by C-G formula based on SCr of 0.73 mg/dL).  Coagulation Profile: Recent Labs  Lab 08/20/20 0420  INR 1.3*    Recent Results (from the past 240 hour(s))  Respiratory Panel by RT PCR (Flu A&B, Covid) - Nasopharyngeal Swab     Status: None   Collection Time: 08/20/20  4:19 AM   Specimen: Nasopharyngeal Swab  Result Value Ref Range Status   SARS Coronavirus 2 by RT PCR NEGATIVE NEGATIVE Final    Comment: (NOTE) SARS-CoV-2 target nucleic acids are NOT DETECTED.  The SARS-CoV-2 RNA is generally detectable in upper respiratoy specimens during the acute phase of infection. The lowest concentration of SARS-CoV-2 viral copies this assay can detect is 131 copies/mL. A negative result does not preclude SARS-Cov-2 infection and should not be used as the sole basis for treatment or other patient management decisions. A negative result may occur with  improper specimen collection/handling, submission of specimen other than nasopharyngeal swab, presence of viral mutation(s) within the areas targeted by this assay, and inadequate number of viral copies (<131 copies/mL). A negative result must be combined with clinical observations, patient history, and epidemiological information. The expected result is Negative.  Fact Sheet for Patients:  https://www.moore.com/  Fact  Sheet for Healthcare Providers:  https://www.young.biz/  This test is no t yet approved or cleared by the [place] FDA and  has been authorized for detection and/or diagnosis of SARS-CoV-2 by FDA under an Emergency Use Authorization (EUA). This EUA will remain  in effect (meaning this test can be used) for the duration of the COVID-19 declaration under Section 564(b)(1) of the Act, 21 U.S.C. section 360bbb-3(b)(1), unless the authorization is terminated or revoked sooner.     Influenza A by PCR NEGATIVE NEGATIVE Final    Influenza B by PCR NEGATIVE NEGATIVE Final    Comment: (NOTE) The Xpert Xpress SARS-CoV-2/FLU/RSV assay is intended as an aid in  the diagnosis of influenza from Nasopharyngeal swab specimens and  should not be used as a sole basis for treatment. Nasal washings and  aspirates are unacceptable for Xpert Xpress SARS-CoV-2/FLU/RSV  testing.  Fact Sheet for Patients: https://www.moore.com/  Fact Sheet for Healthcare Providers: https://www.young.biz/  This test is not yet approved or cleared by the [place] FDA and  has been authorized for detection and/or diagnosis of SARS-CoV-2 by  FDA under an Emergency Use Authorization (EUA). This EUA will remain  in effect (meaning this test can be used) for the duration of the  Covid-19 declaration under Section 564(b)(1) of the Act, 21  U.S.C. section 360bbb-3(b)(1), unless the authorization is  terminated or revoked. Performed at [facility], [address], [place], [place] [address]         Radiology Studies: DG Chest 2 View  Result Date: 08/20/2020 CLINICAL DATA:  Shortness of breath EXAM: CHEST - 2 VIEW COMPARISON:  None. FINDINGS: Marked cardiopericardial enlargement. Interstitial opacity with [employer]. No visible effusion or pneumothorax. Cholecystectomy clips. IMPRESSION: 1. CHF. 2. Marked cardiopericardial enlargement globular heart shape, pericardial effusion could be present. Electronically Signed   By: Marnee Spring M.D.   On: 08/20/2020 05:01   DG Pelvis 1-2 Views  Result Date: 08/20/2020 CLINICAL DATA:  Bilateral lower extremity swelling for 2 days. EXAM: PELVIS - 1-2 VIEW COMPARISON:  None. FINDINGS: Both hips are normally located. No significant degenerative changes or acute bony abnormality. No plain film evidence of avascular necrosis. The pubic symphysis and SI joints are intact. No pelvic fractures or bone lesions. Age advanced vascular calcifications noted.  IMPRESSION: No acute bony findings. Electronically Signed   By: Rudie Meyer M.D.   On: 08/20/2020 06:49   ECHOCARDIOGRAM COMPLETE  Result Date: 08/20/2020    ECHOCARDIOGRAM REPORT   Patient Name:   JOSSELYN HARKINS Date of Exam: 08/20/2020 Medical Rec #:  [id number]        Height:       66.0 in Accession #:    [id number]       Weight:       178.0 lb Date of Birth:  1981-07-11       BSA:          1.903 m Patient Age:    39 years         BP:           158/97 mmHg Patient Gender: F                HR:           89 bpm. Exam Location:  Inpatient Procedure: 2D Echo, Cardiac Doppler and Color Doppler                       STAT ECHO  Reported to: Dr Donato Schultz on 08/20/2020 9:11:00 AM. Indications:    Dyspnea 786.09 / R06.00  History:        Patient has no prior history of Echocardiogram examinations.  Sonographer:    Elmarie Shiley Dance Referring Phys: [id number] ARSHAD N KAKRAKANDY IMPRESSIONS  1. Left ventricular ejection fraction, by estimation, is 25 to 30%. The left ventricle has severely decreased function. The left ventricle demonstrates global hypokinesis. The left ventricular internal cavity size was moderately dilated. Left ventricular diastolic parameters are consistent with Grade III diastolic dysfunction (restrictive). Elevated left atrial pressure.  2. Right ventricular systolic function is normal. The right ventricular size is normal. There is moderately elevated pulmonary artery systolic pressure. The estimated right ventricular systolic pressure is 54.9 mmHg.  3. Left atrial size was severely dilated.  4. Right atrial size was moderately dilated.  5. A small pericardial effusion is present. The pericardial effusion is circumferential. There is no evidence of cardiac tamponade.  6. The mitral valve is normal in structure. Moderate to severe mitral valve regurgitation. No evidence of mitral stenosis.  7. The aortic valve is normal in structure. Aortic valve regurgitation is not visualized. No aortic stenosis is  present.  8. The inferior vena cava is dilated in size with <50% respiratory variability, suggesting right atrial pressure of 15 mmHg. FINDINGS  Left Ventricle: Left ventricular ejection fraction, by estimation, is 25 to 30%. The left ventricle has severely decreased function. The left ventricle demonstrates global hypokinesis. The left ventricular internal cavity size was moderately dilated. There is no left ventricular hypertrophy. Abnormal (paradoxical) septal motion, consistent with left bundle branch block. Left ventricular diastolic parameters are consistent with Grade III diastolic dysfunction (restrictive). Elevated left atrial pressure. Right Ventricle: The right ventricular size is normal. No increase in right ventricular wall thickness. Right ventricular systolic function is normal. There is moderately elevated pulmonary artery systolic pressure. The tricuspid regurgitant velocity is 3.16 m/s, and with an assumed right atrial pressure of 15 mmHg, the estimated right ventricular systolic pressure is 54.9 mmHg. Left Atrium: Left atrial size was severely dilated. Right Atrium: Right atrial size was moderately dilated. Pericardium: A small pericardial effusion is present. The pericardial effusion is circumferential. There is no evidence of cardiac tamponade. Mitral Valve: The mitral valve is normal in structure. There is mild thickening of the mitral valve leaflet(s). There is mild calcification of the mitral valve leaflet(s). Moderate to severe mitral valve regurgitation, with eccentric laterally directed jet. No evidence of mitral valve stenosis. Tricuspid Valve: The tricuspid valve is normal in structure. Tricuspid valve regurgitation is mild . No evidence of tricuspid stenosis. Aortic Valve: The aortic valve is normal in structure. Aortic valve regurgitation is not visualized. No aortic stenosis is present. Pulmonic Valve: The pulmonic valve was normal in structure. Pulmonic valve regurgitation is mild to  moderate. No evidence of pulmonic stenosis. Aorta: The aortic root is normal in size and structure. Venous: The inferior vena cava is dilated in size with less than 50% respiratory variability, suggesting right atrial pressure of 15 mmHg. IAS/Shunts: No atrial level shunt detected by color flow Doppler.  LEFT VENTRICLE PLAX 2D LVIDd:         6.78 cm  Diastology LVIDs:         5.82 cm  LV e' medial:    4.03 cm/s LV PW:         1.29 cm  LV E/e' medial:  40.7 LV IVS:        0.79 cm  LV e' lateral:   8.38 cm/s LVOT diam:     2.00 cm  LV E/e' lateral: 19.6 LV SV:         34 LV SV Index:   18 LVOT Area:     3.14 cm  RIGHT VENTRICLE            IVC RV Basal diam:  4.15 cm    IVC diam: 2.79 cm RV Mid diam:    2.47 cm RV S prime:     8.92 cm/s TAPSE (M-mode): 2.1 cm LEFT ATRIUM              Index       RIGHT ATRIUM           Index LA diam:        4.70 cm  2.47 cm/m  RA Area:     30.20 cm LA Vol (A2C):   119.0 ml 62.53 ml/m RA Volume:   102.00 ml 53.59 ml/m LA Vol (A4C):   108.0 ml 56.75 ml/m LA Biplane Vol: 114.0 ml 59.90 ml/m  AORTIC VALVE LVOT Vmax:   82.10 cm/s LVOT Vmean:  47.900 cm/s LVOT VTI:    0.109 m  AORTA Ao Root diam: 3.00 cm Ao Asc diam:  3.30 cm MITRAL VALVE                 TRICUSPID VALVE MV Area (PHT): 4.18 cm      TR Peak grad:   39.9 mmHg MV Decel Time: 182 msec      TR Vmax:        316.00 cm/s MR Peak grad:    124.5 mmHg MR Mean grad:    78.0 mmHg   SHUNTS MR Vmax:         558.00 cm/s Systemic VTI:  0.11 m MR Vmean:        412.0 cm/s  Systemic Diam: 2.00 cm MR PISA:         1.57 cm MR PISA Eff ROA: 11 mm MR PISA Radius:  0.50 cm MV E velocity: 164.00 cm/s Donato Schultz MD Electronically signed by Donato Schultz MD Signature Date/Time: 08/20/2020/10:23:07 AM    Final    VAS [place] LOWER EXTREMITY VENOUS (DVT)  Result Date: 08/21/2020  Lower Venous DVT Study Indications: Edema.  Comparison Study: No prior studies. Performing Technologist: Jean Rosenthal, RDMS  Examination Guidelines: A complete evaluation  includes B-mode imaging, spectral Doppler, color Doppler, and power Doppler as needed of all accessible portions of each vessel. Bilateral testing is considered an integral part of a complete examination. Limited examinations for reoccurring indications may be performed as noted. The reflux portion of the exam is performed with the patient in reverse Trendelenburg.  +---------+---------------+---------+-----------+----------+--------------+ RIGHT    CompressibilityPhasicitySpontaneityPropertiesThrombus Aging +---------+---------------+---------+-----------+----------+--------------+ CFV      Full           Yes      Yes                                 +---------+---------------+---------+-----------+----------+--------------+ SFJ      Full                                                        +---------+---------------+---------+-----------+----------+--------------+ FV Prox  Full                                                        +---------+---------------+---------+-----------+----------+--------------+  FV Mid   Full                                                        +---------+---------------+---------+-----------+----------+--------------+ FV DistalFull                                                        +---------+---------------+---------+-----------+----------+--------------+ PFV      Full                                                        +---------+---------------+---------+-----------+----------+--------------+ POP      Full           Yes      Yes                                 +---------+---------------+---------+-----------+----------+--------------+ PTV      Full                                                        +---------+---------------+---------+-----------+----------+--------------+ PERO     Full                                                         +---------+---------------+---------+-----------+----------+--------------+   +---------+---------------+---------+-----------+----------+--------------+ LEFT     CompressibilityPhasicitySpontaneityPropertiesThrombus Aging +---------+---------------+---------+-----------+----------+--------------+ CFV      Full           Yes      Yes                                 +---------+---------------+---------+-----------+----------+--------------+ SFJ      Full                                                        +---------+---------------+---------+-----------+----------+--------------+ FV Prox  Full                                                        +---------+---------------+---------+-----------+----------+--------------+ FV Mid   Full                                                        +---------+---------------+---------+-----------+----------+--------------+  FV DistalFull                                                        +---------+---------------+---------+-----------+----------+--------------+ PFV      Full                                                        +---------+---------------+---------+-----------+----------+--------------+ POP      Full           Yes      Yes                                 +---------+---------------+---------+-----------+----------+--------------+ PTV      Full                                                        +---------+---------------+---------+-----------+----------+--------------+ PERO     Full                                                        +---------+---------------+---------+-----------+----------+--------------+     Summary: RIGHT: - There is no evidence of deep vein thrombosis in the lower extremity.  - No cystic structure found in the popliteal fossa.  LEFT: - There is no evidence of deep vein thrombosis in the lower extremity.  - No cystic structure found in the popliteal fossa.   *See table(s) above for measurements and observations. Electronically signed by Coral Else MD on 08/21/2020 at 7:31:02 AM.    Final    US Abdomen Limited RUQ (LIVER/GB)  Result Date: 08/20/2020 CLINICAL DATA:  Cirrhosis EXAM: ULTRASOUND ABDOMEN LIMITED RIGHT UPPER QUADRANT COMPARISON:  None. FINDINGS: Gallbladder: Cholecystectomy. Common bile duct: Diameter: 4.1 mm, within normal limits. Liver: No focal lesion identified. Within normal limits in parenchymal echogenicity. Portal vein is patent on color Doppler imaging with normal direction of blood flow towards the liver. IMPRESSION: No acute abnormality. Electronically Signed   By: Feliberto Harts MD   On: 08/20/2020 07:54        Scheduled Meds: . carvedilol  12.5 mg Oral BID WC  . feeding supplement  237 mL Oral BID BM  . folic acid  1 mg Oral Daily  . furosemide  60 mg Intravenous BID  . LORazepam  0-4 mg Oral Q6H   Followed by  . [START ON 08/22/2020] LORazepam  0-4 mg Oral Q12H  . multivitamin with minerals  1 tablet Oral Daily  . potassium chloride  40 mEq Oral BID  . sacubitril-valsartan  1 tablet Oral BID  . sodium chloride flush  3 mL Intravenous Q12H  . spironolactone  25 mg Oral Daily  . thiamine  100 mg Oral Daily   Or  . thiamine  100 mg Intravenous Daily   Continuous Infusions:  LOS: 1 day     Jacquelin Hawking, MD [facility] 08/21/2020, 12:11 PM  If 7PM-7AM, please contact night-coverage [email or web address]

## 2020-08-21 NOTE — Progress Notes (Signed)
Heart Failure Stewardship Pharmacist Progress Note   PCP: Patient, No Pcp Per PCP-Cardiologist: No primary care provider on file.    HPI:  39 yo F with PMH of tobacco and alcohol abuse without any other known medical history. Does have remarkable family history of cardiac disease. She presented to the ED on 08/19/20 with LE edema, orthopnea, and shortness of breath (both at rest and on exertion). An ECHO was done on 08/20/20 and her LVEF is reduced to 25-30%.  Current HF Medications: Furosemide 60 mg IV BID Carvedilol 12.5 mg BID Entresto 49-51 mg BID  Spironolactone 25 mg daily   Prior to admission HF Medications: None  Pertinent Lab Values: . Serum creatinine 0.73, BUN 13, Potassium 3.9, Sodium 138, BNP 2726.1  Vital Signs: . Weight: 169.75 lbs (admission weight: 178 lbs) . Blood pressure: 140-150s/100 . Heart rate: 80-90s  . ReDS reading: 41%  Medication Assistance / Insurance Benefits Check: Does the patient have prescription insurance?  No Type of insurance plan: N/A  Does the patient qualify for medication assistance through manufacturers or grants?   Pending household income information . Eligible grants and/or patient assistance programs: Entresto . Medication assistance applications in progress: Entresto  . Medication assistance applications approved: none Approved medication assistance renewals will be completed by: [facility] Office  Outpatient Pharmacy:  Prior to admission outpatient pharmacy: [facility] Is the patient willing to use [facility] TOC pharmacy at discharge? Yes Is the patient willing to transition their outpatient pharmacy to utilize a [facility] outpatient pharmacy?   Pending    Assessment: 1. Acute systolic CHF (EF 16-10%), due to unknown etiology - pending L/RHC. NYHA class III symptoms. - Given significant response to diuretic dose yesterday (ReDS 51%>41%), consider reducing furosemide dose to 40 mg IV BID  - Continue carvedilol 12.5 mg BID - Continue  Entresto - patient assistance application has been completed and faxed, will continue to follow for updates - Continue spironolactone 25 mg daily  - SGLT2i prior to discharge    Plan: 1) Medication changes recommended at this time: - Reduced furosemide to 40 mg IV BID  2) Patient assistance application(s): - Entresto patient assistance application has been submitted through [employer], will continue to follow for updates   3)  Education  - To be completed prior to discharge   Theodis Sato, PharmD PGY-1 [facility] Resident   08/21/2020 11:22 AM

## 2020-08-21 NOTE — Progress Notes (Addendum)
Initial Nutrition Assessment  DOCUMENTATION CODES:   Not applicable  INTERVENTION:   Ensure Enlive po once a day, each supplement provides 350 kcal and 20 grams of protein  MVI with minerals  Provided education regarding HF failure diet and importance of daily weights.   Provided "Low-Sodium Nutrition Therapy" handout from Nutrition Care Manual.    NUTRITION DIAGNOSIS:   Inadequate oral intake related to decreased appetite as evidenced by meal completion < 50%, per patient/family report.   GOAL:   Patient will meet greater than or equal to 90% of their needs   MONITOR:   PO intake, Supplement acceptance  REASON FOR ASSESSMENT:   Malnutrition Screening Tool    ASSESSMENT:   Pt with PMH of tobacco and alcohol abuse and likely untreated chronic hypertension. Remarkable family history of cardiac disease. Presented with LE edema, SOB, orthopnea. Acute CHF.  Pt reports her intake is decreased but she consumes at least two meals a day. Typical intake: Breakfast: eggs and sausage, Lunch: tortilla chips with cheese, Dinner: grilled chicken salad. Pt is eating most of meals while in hospital and is willing to try nutrition supplements to increase calorie and protein intake.   Pt reports gradual unintended weight decline over the past 6-9 months of ~15 lbs but was not sure if weight loss was d/t CHF or not eating out as frequently d/t covid. Pt on Lasix BID, since admission, pt has experienced an overall 3.7 kg loss of fluid.     RD provided education regarding choosing foods low in sodium. Educated pt on how to make changes when cooking at home and eating out. Educated pt on the importance of weighing self daily to assess fluid accumulation in CHF. Pt receptive to advice and willing to make changes.   Labs reviewed.   Medications reviewed and include: Ensure Enlive BID, MVI with minerals, folic acid, thiamine inj, KCl po, Lasix.     NUTRITION - FOCUSED PHYSICAL EXAM:     Most Recent Value  Orbital Region No depletion  Upper Arm Region Mild depletion  Thoracic and Lumbar Region No depletion  Buccal Region No depletion  Temple Region No depletion  Clavicle Bone Region No depletion  Clavicle and Acromion Bone Region No depletion  Scapular Bone Region No depletion  Dorsal Hand No depletion  Patellar Region No depletion  Anterior Thigh Region Unable to assess  Posterior Calf Region No depletion  Edema (RD Assessment) Moderate  Hair Unable to assess  Eyes Reviewed  Mouth Reviewed  Skin Reviewed  Nails Reviewed       Diet Order:   Diet Order            Diet NPO time specified  Diet effective midnight           Diet Heart Room service appropriate? Yes; Fluid consistency: Thin; Fluid restriction: 1200 mL Fluid  Diet effective now                 EDUCATION NEEDS:   Education needs have been addressed  Skin:  Skin Assessment: Reviewed RN Assessment  Last BM:  unknown  Height:   Ht Readings from Last 1 Encounters:  08/20/20 5\' 8"  (1.727 m)    Weight:   Wt Readings from Last 1 Encounters:  08/21/20 77 kg    Ideal Body Weight:  63.6 kg  BMI:  Body mass index is 25.81 kg/m.  Estimated Nutritional Needs:   Kcal:  1900-2100  Protein:  95-115 grams  Fluid:  1.2  Lora Havens, Dietetic Intern Pager: [phone] If unavailable: [phone]

## 2020-08-21 NOTE — Progress Notes (Signed)
  ReDS Clip Diuretic Study Pt study # [id number]  Your patient has been enrolled in the ReDS Clip Diuretic Study  Has received IV lasix 40 mg x 1. ReDS reading much improved today 51%>41%. SCr stable 0.73. Urine output documented net -200 mL (likely not accurate). Not on PTA diuretics. Current order placed for lasix IV 80 mg BID starting tonight.   Changes to prescribed diuretics recommended:  Consider switching back to lasix 40 mg IV BID Provider contacted: Dr. Marchia Bond Recommendation was accepted by provider.    REDS Clip  READING= 41%  CHEST RULER = 31 Clip Station = C   Orthodema score = 1 Signs/Symptoms Score   Mild edema, no orthopnea 0 No congestion  Moderate edema, no orthopnea 1 Low-grade orthodema/congestion  Severe edema OR orthopnea 2   Moderate edema and orthopnea 3 High-grade orthodema/congestion  Severe edema AND orthopnea 4    Sharen Hones, PharmD, BCPS Phone [phone]  Please check [email or web address] for unit-specific pharmacist phone numbers

## 2020-08-21 NOTE — Progress Notes (Addendum)
Progress Note  Patient Name: Isabella Sanders Date of Encounter: 08/21/2020  Primary Cardiologist: No primary care provider on file.   Subjective   Patient resting in chair eating breakfast. She states that she is feeling much better today. Her lower extremity edema and shortness of breath has improved. She continues to have a mild nonproductive cough, but otherwise denies chest pain, dyspnea, fevers, chills. She is tolerating the lasix well and admits to good urine output overnight.   Inpatient Medications    Scheduled Meds: . carvedilol  12.5 mg Oral BID WC  . feeding supplement  237 mL Oral BID BM  . folic acid  1 mg Oral Daily  . furosemide  80 mg Intravenous BID  . LORazepam  0-4 mg Oral Q6H   Followed by  . [START ON 08/22/2020] LORazepam  0-4 mg Oral Q12H  . multivitamin with minerals  1 tablet Oral Daily  . potassium chloride  40 mEq Oral BID  . sacubitril-valsartan  1 tablet Oral BID  . sodium chloride flush  3 mL Intravenous Q12H  . thiamine  100 mg Oral Daily   Or  . thiamine  100 mg Intravenous Daily   Continuous Infusions:  PRN Meds: hydrALAZINE, LORazepam **OR** LORazepam   Vital Signs    Vitals:   08/21/20 0030 08/21/20 0134 08/21/20 0420 08/21/20 0709  BP: (!) 145/106  (!) 150/96 (!) 150/101  Pulse: 89  87 83  Resp: Temp: 98.4 F (36.9 C)  98.6 F (37 C) 98 F (36.7 C)  TempSrc: Oral  Oral Oral  SpO2: 100%  100% 99%  Weight: 77 kg 77 kg    Height:        Intake/Output Summary (Last 24 hours) at 08/21/2020 0742 Last data filed at 08/21/2020 0200 Gross per 24 hour  Intake 240 ml  Output 450 ml  Net -210 ml   Filed Weights   08/20/20 1758 08/21/20 0030 08/21/20 0134  Weight: 76.5 kg 77 kg 77 kg    Telemetry    Sinus rhythm with LBBB - Personally Reviewed  ECG    Not performed today - Personally Reviewed  Physical Exam   GEN: No acute distress.   Neck: elevated JVD/hepatojugular reflux but improved since  yesterday. Cardiac: RRR with S3.  Respiratory: Clear to auscultation bilaterally. GI: Soft, nontender, non-distended  MS: 1-2+ pitting edema bilaterally to her knees, improved since yesterday.  Neuro:  Nonfocal  Psych: Normal affect   Labs    Chemistry Recent Labs  Lab 08/19/20 2327 08/21/20 0209  NA 137 138  K 3.3* 3.9  CL 104 104  CO2 26 25  GLUCOSE 119* 104*  BUN 11 13  CREATININE 0.71 0.73  CALCIUM 8.6* 8.9  PROT 6.1* 6.5  ALBUMIN 3.1* 3.4*  AST 24 28  ALT 24 27  ALKPHOS 80 88  BILITOT 0.5 0.6  GFRNONAA >60 >60  ANIONGAP 7 9     Hematology Recent Labs  Lab 08/19/20 2327 08/20/20 1224 08/21/20 0209  WBC 4.4  --  4.3  RBC 3.79* 3.80* 4.10  HGB 7.7*  --  8.3*  HCT 26.8*  --  28.6*  MCV 70.7*  --  69.8*  MCH 20.3*  --  20.2*  MCHC 28.7*  --  29.0*  RDW 20.9*  --  20.7*  PLT 352  --  356    Cardiac Enzymes: 16>21 (08/21/20)  BNP Recent Labs  Lab 08/20/20 0420  BNP 2,726.1*  DDimer: none    Radiology    DG Chest 2 View  Result Date: 08/20/2020 CLINICAL DATA:  Shortness of breath EXAM: CHEST - 2 VIEW COMPARISON:  None. FINDINGS: Marked cardiopericardial enlargement. Interstitial opacity with [employer]. No visible effusion or pneumothorax. Cholecystectomy clips. IMPRESSION: 1. CHF. 2. Marked cardiopericardial enlargement globular heart shape, pericardial effusion could be present. Electronically Signed   By: Marnee Spring M.D.   On: 08/20/2020 05:01   DG Pelvis 1-2 Views  Result Date: 08/20/2020 CLINICAL DATA:  Bilateral lower extremity swelling for 2 days. EXAM: PELVIS - 1-2 VIEW COMPARISON:  None. FINDINGS: Both hips are normally located. No significant degenerative changes or acute bony abnormality. No plain film evidence of avascular necrosis. The pubic symphysis and SI joints are intact. No pelvic fractures or bone lesions. Age advanced vascular calcifications noted. IMPRESSION: No acute bony findings. Electronically Signed   By: Rudie Meyer M.D.   On: 08/20/2020 06:49   ECHOCARDIOGRAM COMPLETE  Result Date: 08/20/2020    ECHOCARDIOGRAM REPORT   Patient Name:   Isabella Sanders Date of Exam: 08/20/2020 Medical Rec #:  [id number]        Height:       66.0 in Accession #:    [id number]       Weight:       178.0 lb Date of Birth:  December 21, 1980       BSA:          1.903 m Patient Age:    39 years         BP:           158/97 mmHg Patient Gender: F                HR:           89 bpm. Exam Location:  Inpatient Procedure: 2D Echo, Cardiac Doppler and Color Doppler                       STAT ECHO Reported to: Dr Donato Schultz on 08/20/2020 9:11:00 AM. Indications:    Dyspnea 786.09 / R06.00  History:        Patient has no prior history of Echocardiogram examinations.  Sonographer:    Elmarie Shiley Dance Referring Phys: [id number] ARSHAD N KAKRAKANDY IMPRESSIONS  1. Left ventricular ejection fraction, by estimation, is 25 to 30%. The left ventricle has severely decreased function. The left ventricle demonstrates global hypokinesis. The left ventricular internal cavity size was moderately dilated. Left ventricular diastolic parameters are consistent with Grade III diastolic dysfunction (restrictive). Elevated left atrial pressure.  2. Right ventricular systolic function is normal. The right ventricular size is normal. There is moderately elevated pulmonary artery systolic pressure. The estimated right ventricular systolic pressure is 54.9 mmHg.  3. Left atrial size was severely dilated.  4. Right atrial size was moderately dilated.  5. A small pericardial effusion is present. The pericardial effusion is circumferential. There is no evidence of cardiac tamponade.  6. The mitral valve is normal in structure. Moderate to severe mitral valve regurgitation. No evidence of mitral stenosis.  7. The aortic valve is normal in structure. Aortic valve regurgitation is not visualized. No aortic stenosis is present.  8. The inferior vena cava is dilated in size with <50%  respiratory variability, suggesting right atrial pressure of 15 mmHg. FINDINGS  Left Ventricle: Left ventricular ejection fraction, by estimation, is 25 to 30%. The left ventricle has severely decreased function. The left  ventricle demonstrates global hypokinesis. The left ventricular internal cavity size was moderately dilated. There is no left ventricular hypertrophy. Abnormal (paradoxical) septal motion, consistent with left bundle branch block. Left ventricular diastolic parameters are consistent with Grade III diastolic dysfunction (restrictive). Elevated left atrial pressure. Right Ventricle: The right ventricular size is normal. No increase in right ventricular wall thickness. Right ventricular systolic function is normal. There is moderately elevated pulmonary artery systolic pressure. The tricuspid regurgitant velocity is 3.16 m/s, and with an assumed right atrial pressure of 15 mmHg, the estimated right ventricular systolic pressure is 54.9 mmHg. Left Atrium: Left atrial size was severely dilated. Right Atrium: Right atrial size was moderately dilated. Pericardium: A small pericardial effusion is present. The pericardial effusion is circumferential. There is no evidence of cardiac tamponade. Mitral Valve: The mitral valve is normal in structure. There is mild thickening of the mitral valve leaflet(s). There is mild calcification of the mitral valve leaflet(s). Moderate to severe mitral valve regurgitation, with eccentric laterally directed jet. No evidence of mitral valve stenosis. Tricuspid Valve: The tricuspid valve is normal in structure. Tricuspid valve regurgitation is mild . No evidence of tricuspid stenosis. Aortic Valve: The aortic valve is normal in structure. Aortic valve regurgitation is not visualized. No aortic stenosis is present. Pulmonic Valve: The pulmonic valve was normal in structure. Pulmonic valve regurgitation is mild to moderate. No evidence of pulmonic stenosis. Aorta: The aortic  root is normal in size and structure. Venous: The inferior vena cava is dilated in size with less than 50% respiratory variability, suggesting right atrial pressure of 15 mmHg. IAS/Shunts: No atrial level shunt detected by color flow Doppler.  LEFT VENTRICLE PLAX 2D LVIDd:         6.78 cm  Diastology LVIDs:         5.82 cm  LV e' medial:    4.03 cm/s LV PW:         1.29 cm  LV E/e' medial:  40.7 LV IVS:        0.79 cm  LV e' lateral:   8.38 cm/s LVOT diam:     2.00 cm  LV E/e' lateral: 19.6 LV SV:         34 LV SV Index:   18 LVOT Area:     3.14 cm  RIGHT VENTRICLE            IVC RV Basal diam:  4.15 cm    IVC diam: 2.79 cm RV Mid diam:    2.47 cm RV S prime:     8.92 cm/s TAPSE (M-mode): 2.1 cm LEFT ATRIUM              Index       RIGHT ATRIUM           Index LA diam:        4.70 cm  2.47 cm/m  RA Area:     30.20 cm LA Vol (A2C):   119.0 ml 62.53 ml/m RA Volume:   102.00 ml 53.59 ml/m LA Vol (A4C):   108.0 ml 56.75 ml/m LA Biplane Vol: 114.0 ml 59.90 ml/m  AORTIC VALVE LVOT Vmax:   82.10 cm/s LVOT Vmean:  47.900 cm/s LVOT VTI:    0.109 m  AORTA Ao Root diam: 3.00 cm Ao Asc diam:  3.30 cm MITRAL VALVE                 TRICUSPID VALVE MV Area (PHT): 4.18 cm  TR Peak grad:   39.9 mmHg MV Decel Time: 182 msec      TR Vmax:        316.00 cm/s MR Peak grad:    124.5 mmHg MR Mean grad:    78.0 mmHg   SHUNTS MR Vmax:         558.00 cm/s Systemic VTI:  0.11 m MR Vmean:        412.0 cm/s  Systemic Diam: 2.00 cm MR PISA:         1.57 cm MR PISA Eff ROA: 11 mm MR PISA Radius:  0.50 cm MV E velocity: 164.00 cm/s Donato Schultz MD Electronically signed by Donato Schultz MD Signature Date/Time: 08/20/2020/10:23:07 AM    Final    VAS [place] LOWER EXTREMITY VENOUS (DVT)  Result Date: 08/21/2020  Lower Venous DVT Study Indications: Edema.  Comparison Study: No prior studies. Performing Technologist: Jean Rosenthal, RDMS  Examination Guidelines: A complete evaluation includes B-mode imaging, spectral Doppler, color Doppler, and  power Doppler as needed of all accessible portions of each vessel. Bilateral testing is considered an integral part of a complete examination. Limited examinations for reoccurring indications may be performed as noted. The reflux portion of the exam is performed with the patient in reverse Trendelenburg.  +---------+---------------+---------+-----------+----------+--------------+ RIGHT    CompressibilityPhasicitySpontaneityPropertiesThrombus Aging +---------+---------------+---------+-----------+----------+--------------+ CFV      Full           Yes      Yes                                 +---------+---------------+---------+-----------+----------+--------------+ SFJ      Full                                                        +---------+---------------+---------+-----------+----------+--------------+ FV Prox  Full                                                        +---------+---------------+---------+-----------+----------+--------------+ FV Mid   Full                                                        +---------+---------------+---------+-----------+----------+--------------+ FV DistalFull                                                        +---------+---------------+---------+-----------+----------+--------------+ PFV      Full                                                        +---------+---------------+---------+-----------+----------+--------------+ POP      Full  Yes      Yes                                 +---------+---------------+---------+-----------+----------+--------------+ PTV      Full                                                        +---------+---------------+---------+-----------+----------+--------------+ PERO     Full                                                        +---------+---------------+---------+-----------+----------+--------------+    +---------+---------------+---------+-----------+----------+--------------+ LEFT     CompressibilityPhasicitySpontaneityPropertiesThrombus Aging +---------+---------------+---------+-----------+----------+--------------+ CFV      Full           Yes      Yes                                 +---------+---------------+---------+-----------+----------+--------------+ SFJ      Full                                                        +---------+---------------+---------+-----------+----------+--------------+ FV Prox  Full                                                        +---------+---------------+---------+-----------+----------+--------------+ FV Mid   Full                                                        +---------+---------------+---------+-----------+----------+--------------+ FV DistalFull                                                        +---------+---------------+---------+-----------+----------+--------------+ PFV      Full                                                        +---------+---------------+---------+-----------+----------+--------------+ POP      Full           Yes      Yes                                 +---------+---------------+---------+-----------+----------+--------------+ PTV  Full                                                        +---------+---------------+---------+-----------+----------+--------------+ PERO     Full                                                        +---------+---------------+---------+-----------+----------+--------------+     Summary: RIGHT: - There is no evidence of deep vein thrombosis in the lower extremity.  - No cystic structure found in the popliteal fossa.  LEFT: - There is no evidence of deep vein thrombosis in the lower extremity.  - No cystic structure found in the popliteal fossa.  *See table(s) above for measurements and observations. Electronically signed  by Coral Else MD on 08/21/2020 at 7:31:02 AM.    Final    US Abdomen Limited RUQ (LIVER/GB)  Result Date: 08/20/2020 CLINICAL DATA:  Cirrhosis EXAM: ULTRASOUND ABDOMEN LIMITED RIGHT UPPER QUADRANT COMPARISON:  None. FINDINGS: Gallbladder: Cholecystectomy. Common bile duct: Diameter: 4.1 mm, within normal limits. Liver: No focal lesion identified. Within normal limits in parenchymal echogenicity. Portal vein is patent on color Doppler imaging with normal direction of blood flow towards the liver. IMPRESSION: No acute abnormality. Electronically Signed   By: Feliberto Harts MD   On: 08/20/2020 07:54    Cardiac Studies   See above  Patient Profile     Isabella Sanders is a 39 y.o. female with a history of tobacco and alcohol use disorder but no other known medical history (although she has not been seen by a medical provider in 17 years) who is being seen today for the evaluation of new onset CHF after presenting with significant lower extremity edema.  Assessment & Plan    1. 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 Additionally the LV shows grade III diastolic dysfunction without evidence of LV hypertrophy. Her TSH 2.161, A1c 5.3, and HIV was negative. She was started on carvedilol 12.5 mg BID, and Lasix 40 mg BID. She states that she has been urinating frequently despite her INOs reflecting minimal urine output. She has not yet received her increase dose of lasix at 80 mg and clinically appears to be improving on 40 mg BID. Patient is hypertensive today at 150s/100s with a potassium of 3.9 and Cr of 0.73 (GFR >60). She continue to appear hypervolemic on  exam, but admits to improved symptoms. Her biggest risk factors for heart failure include uncontrolled HTN, tobacco/ETOH use, and significant family history of CAD. Patient would likely benefit from starting Spironolactone today. - Continue furosemide 40 mg IV BID - Continue Carvedilol 12.5 mg BID - Continue Losartan 50 mg daily - Start Spironolactone 25 mg daily - Will perform right/left heart cath during this hospitalization  2. Hypoproliferative, Iron Deficiency Anemia: Patient presents with a microcytic anemia with a Hgb of 7.7, MCV 70, and RDW 20.7. Subsequent studies  showed a significantly low ferritin of 7 with an expected normal B12/Folate. Her reticulocyte index is 1.2 with a calculated iron deficit by Loel Ro equation of 1,184 mg. She denies any obvious signs of bleeding such as hematuria, melena, hematochezia, or menorrhagia. U/A was not performed and FOBT is pending. Patient does have a family history of sickle cell anemia with at least one first degree relative positive for sickle cell trait. Otherwise, she denies any significant family history of colon cancer. Considering her significant personal and family history of CV disease, her transfusion goal is <8.0. - FOBT pending - Blood smear, LDH, and haptoglobin pending. - Consider Iron transfusion Duncan Dull) today - Will need outpatient GI consult for diagnostic EGD/conoloscopy   3. HTN: Patient's blood pressure remains elevated today at 150's/100's on carvedilol 12.5 mg BID, losartan 50 mg daily. - Will start Spironolactone 25 mg today.   4. Hypokalemia: - Resolved. Will continue to monitor with the addition of antihypertensive medications.   5. Hyperlipidemia: Patients total cholesterol is 122, LDL 82, and HDL of 32. Her ASCVD is 4.5-15.4% in the next 10 years. She will get a left and right heart cath during this hospitalization, which will help guide the need for statin medication for primary vs secondary prevention of CAD.         For questions or updates, please contact [facility] Please consult [email or web address] for contact info under Cardiology/STEMI.      Signed, Dellia Cloud, MD  08/21/2020, 7:42 AM   Pager: [phone]

## 2020-08-21 NOTE — Consult Note (Signed)
[facility] Consultation Note  Referring Provider: [facility] Primary Care Physician:  Patient, No Pcp Per  Reason for Consultation:  Microcytic iron-deficiency anemia  HPI: Isabella Sanders is a 39 y.o. female presenting with heart failure symptoms.  Found to have microcytic anemia.  No abdominal pain, nausea, vomiting, hematemesis, melena, hematochezia.  No prior anemia to her knowledge.  Has heavy menses and family history of sickle cell disease.  No prior endoscopy or colonoscopy to her knowledge.   Past Medical History:  Diagnosis Date  . Tobacco abuse     Past Surgical History:  Procedure Laterality Date  . CESAREAN SECTION    . CHOLECYSTECTOMY      Prior to Admission medications   Medication Sig Start Date End Date Taking? Authorizing Provider  guaiFENesin (MUCINEX) 600 MG 12 hr tablet Take 600 mg by mouth 2 (two) times daily as needed.   Yes [provider]  naproxen sodium (ALEVE) 220 MG tablet Take 440 mg by mouth 2 (two) times daily as needed (pain).   Yes [provider]  hydrOXYzine (ATARAX/VISTARIL) 25 MG tablet Take 1 tablet (25 mg total) by mouth every 6 (six) hours. Patient not taking: Reported on 08/20/2020 02/29/20   Cristina Gong, PA-C    Current Facility-Administered Medications  Medication Dose Route Frequency Provider Last Rate Last Admin  . carvedilol (COREG) tablet 12.5 mg  12.5 mg Oral BID WC Irene Limbo, Callie E, PA-C   12.5 mg at 08/21/20 0901  . feeding supplement (ENSURE ENLIVE / ENSURE PLUS) liquid 237 mL  237 mL Oral BID BM Narda Bonds, MD   237 mL at 08/21/20 0901  . folic acid (FOLVITE) tablet 1 mg  1 mg Oral Daily Eduard Clos, MD   1 mg at 08/21/20 0901  . furosemide (LASIX) injection 60 mg  60 mg Intravenous BID Dellia Cloud, MD      . hydrALAZINE (APRESOLINE) injection 10 mg  10 mg Intravenous Q4H PRN Eduard Clos, MD      . LORazepam (ATIVAN) tablet 1-4 mg  1-4 mg Oral Q1H PRN  Eduard Clos, MD       Or  . LORazepam (ATIVAN) injection 1-4 mg  1-4 mg Intravenous Q1H PRN Eduard Clos, MD      . LORazepam (ATIVAN) tablet 0-4 mg  0-4 mg Oral Q6H Eduard Clos, MD       Followed by  . [START ON 08/22/2020] LORazepam (ATIVAN) tablet 0-4 mg  0-4 mg Oral Q12H Eduard Clos, MD      . multivitamin with minerals tablet 1 tablet  1 tablet Oral Daily Eduard Clos, MD   1 tablet at 08/21/20 0901  . potassium chloride SA (KLOR-CON) CR tablet 40 mEq  40 mEq Oral BID Corrin Parker, PA-C   40 mEq at 08/21/20 0901  . sacubitril-valsartan (ENTRESTO) 49-51 mg per tablet  1 tablet Oral BID Corrin Parker, PA-C   1 tablet at 08/21/20 0900  . sodium chloride flush (NS) 0.9 % injection 3 mL  3 mL Intravenous Q12H Narda Bonds, MD   3 mL at 08/21/20 0901  . spironolactone (ALDACTONE) tablet 25 mg  25 mg Oral Daily Sande Rives, MD   25 mg at 08/21/20 1014  . thiamine tablet 100 mg  100 mg Oral Daily Eduard Clos, MD   100 mg at 08/20/20 0957   Or  . thiamine (B-1) injection 100 mg  100 mg  Intravenous Daily Eduard Clos, MD   100 mg at 08/21/20 0900    Allergies as of 08/19/2020  . (No Known Allergies)    Family History  Problem Relation Age of Onset  . Sudden Cardiac Death Sister 40  . Heart attack Sister        "mild" heart attack in her 14's    Social History   Socioeconomic History  . Marital status: Single    Spouse name: Not on file  . Number of children: Not on file  . Years of education: Not on file  . Highest education level: Not on file  Occupational History  . Not on file  Tobacco Use  . Smoking status: Current Every Day Smoker  . Smokeless tobacco: Never Used  Substance and Sexual Activity  . Alcohol use: Yes    Comment: everyday.  . Drug use: Not Currently  . Sexual activity: Not on file  Other Topics Concern  . Not on file  Social History Narrative  . Not on file   Social  Determinants of Health   Financial Resource Strain:   . Difficulty of Paying Living Expenses: Not on file  Food Insecurity:   . Worried About Programme researcher, broadcasting/film/video in the Last Year: Not on file  . Ran Out of Food in the Last Year: Not on file  Transportation Needs:   . Lack of Transportation (Medical): Not on file  . Lack of Transportation (Non-Medical): Not on file  Physical Activity:   . Days of Exercise per Week: Not on file  . Minutes of Exercise per Session: Not on file  Stress:   . Feeling of Stress : Not on file  Social Connections:   . Frequency of Communication with Friends and Family: Not on file  . Frequency of Social Gatherings with Friends and Family: Not on file  . Attends Religious Services: Not on file  . Active Member of Clubs or Organizations: Not on file  . Attends Banker Meetings: Not on file  . Marital Status: Not on file  Intimate Partner Violence:   . Fear of Current or Ex-Partner: Not on file  . Emotionally Abused: Not on file  . Physically Abused: Not on file  . Sexually Abused: Not on file    Review of Systems: As per HPI, all others negative  Physical Exam: Vital signs in last 24 hours: Temp:  [97.4 F (36.3 C)-98.6 F (37 C)] 98 F (36.7 C) (11/11 0709) Pulse Rate:  [83-98] 83 (11/11 0709) Resp:  [16-35] 20 (11/11 0709) BP: (139-159)/(96-111) 150/101 (11/11 0709) SpO2:  [99 %-100 %] 99 % (11/11 0709) Weight:  [76.5 kg-77 kg] 77 kg (11/11 0134) Last BM Date: 08/20/20 General:  NAD NEURO:  Non-focal without lateralizing signs. SKIN:  Occasional ecchymoses ABD:  Non-distended   Lab Results: Recent Labs    08/19/20 2327 08/21/20 0209  WBC 4.4 4.3  HGB 7.7* 8.3*  HCT 26.8* 28.6*  PLT 352 356   BMET Recent Labs    08/19/20 2327 08/21/20 0209  NA 137 138  K 3.3* 3.9  CL 104 104  CO2 26 25  GLUCOSE 119* 104*  BUN 11 13  CREATININE 0.71 0.73  CALCIUM 8.6* 8.9   LFT Recent Labs    08/21/20 0209  PROT 6.5   ALBUMIN 3.4*  AST 28  ALT 27  ALKPHOS 88  BILITOT 0.6   PT/INR Recent Labs    08/20/20 0420  LABPROT 15.7*  INR 1.3*    Studies/Results: DG Chest 2 View  Result Date: 08/20/2020 CLINICAL DATA:  Shortness of breath EXAM: CHEST - 2 VIEW COMPARISON:  None. FINDINGS: Marked cardiopericardial enlargement. Interstitial opacity with [employer]. No visible effusion or pneumothorax. Cholecystectomy clips. IMPRESSION: 1. CHF. 2. Marked cardiopericardial enlargement globular heart shape, pericardial effusion could be present. Electronically Signed   By: Marnee Spring M.D.   On: 08/20/2020 05:01   DG Pelvis 1-2 Views  Result Date: 08/20/2020 CLINICAL DATA:  Bilateral lower extremity swelling for 2 days. EXAM: PELVIS - 1-2 VIEW COMPARISON:  None. FINDINGS: Both hips are normally located. No significant degenerative changes or acute bony abnormality. No plain film evidence of avascular necrosis. The pubic symphysis and SI joints are intact. No pelvic fractures or bone lesions. Age advanced vascular calcifications noted. IMPRESSION: No acute bony findings. Electronically Signed   By: Rudie Meyer M.D.   On: 08/20/2020 06:49   ECHOCARDIOGRAM COMPLETE  Result Date: 08/20/2020    ECHOCARDIOGRAM REPORT   Patient Name:   Isabella Sanders Date of Exam: 08/20/2020 Medical Rec #:  [id number]        Height:       66.0 in Accession #:    [id number]       Weight:       178.0 lb Date of Birth:  April 01, 1981       BSA:          1.903 m Patient Age:    39 years         BP:           158/97 mmHg Patient Gender: F                HR:           89 bpm. Exam Location:  Inpatient Procedure: 2D Echo, Cardiac Doppler and Color Doppler                       STAT ECHO Reported to: Dr Donato Schultz on 08/20/2020 9:11:00 AM. Indications:    Dyspnea 786.09 / R06.00  History:        Patient has no prior history of Echocardiogram examinations.  Sonographer:    Elmarie Shiley Dance Referring Phys: [id number] ARSHAD N KAKRAKANDY IMPRESSIONS   1. Left ventricular ejection fraction, by estimation, is 25 to 30%. The left ventricle has severely decreased function. The left ventricle demonstrates global hypokinesis. The left ventricular internal cavity size was moderately dilated. Left ventricular diastolic parameters are consistent with Grade III diastolic dysfunction (restrictive). Elevated left atrial pressure.  2. Right ventricular systolic function is normal. The right ventricular size is normal. There is moderately elevated pulmonary artery systolic pressure. The estimated right ventricular systolic pressure is 54.9 mmHg.  3. Left atrial size was severely dilated.  4. Right atrial size was moderately dilated.  5. A small pericardial effusion is present. The pericardial effusion is circumferential. There is no evidence of cardiac tamponade.  6. The mitral valve is normal in structure. Moderate to severe mitral valve regurgitation. No evidence of mitral stenosis.  7. The aortic valve is normal in structure. Aortic valve regurgitation is not visualized. No aortic stenosis is present.  8. The inferior vena cava is dilated in size with <50% respiratory variability, suggesting right atrial pressure of 15 mmHg. FINDINGS  Left Ventricle: Left ventricular ejection fraction, by estimation, is 25 to 30%. The left ventricle has severely decreased function. The left ventricle demonstrates global hypokinesis.  The left ventricular internal cavity size was moderately dilated. There is no left ventricular hypertrophy. Abnormal (paradoxical) septal motion, consistent with left bundle branch block. Left ventricular diastolic parameters are consistent with Grade III diastolic dysfunction (restrictive). Elevated left atrial pressure. Right Ventricle: The right ventricular size is normal. No increase in right ventricular wall thickness. Right ventricular systolic function is normal. There is moderately elevated pulmonary artery systolic pressure. The tricuspid regurgitant  velocity is 3.16 m/s, and with an assumed right atrial pressure of 15 mmHg, the estimated right ventricular systolic pressure is 54.9 mmHg. Left Atrium: Left atrial size was severely dilated. Right Atrium: Right atrial size was moderately dilated. Pericardium: A small pericardial effusion is present. The pericardial effusion is circumferential. There is no evidence of cardiac tamponade. Mitral Valve: The mitral valve is normal in structure. There is mild thickening of the mitral valve leaflet(s). There is mild calcification of the mitral valve leaflet(s). Moderate to severe mitral valve regurgitation, with eccentric laterally directed jet. No evidence of mitral valve stenosis. Tricuspid Valve: The tricuspid valve is normal in structure. Tricuspid valve regurgitation is mild . No evidence of tricuspid stenosis. Aortic Valve: The aortic valve is normal in structure. Aortic valve regurgitation is not visualized. No aortic stenosis is present. Pulmonic Valve: The pulmonic valve was normal in structure. Pulmonic valve regurgitation is mild to moderate. No evidence of pulmonic stenosis. Aorta: The aortic root is normal in size and structure. Venous: The inferior vena cava is dilated in size with less than 50% respiratory variability, suggesting right atrial pressure of 15 mmHg. IAS/Shunts: No atrial level shunt detected by color flow Doppler.  LEFT VENTRICLE PLAX 2D LVIDd:         6.78 cm  Diastology LVIDs:         5.82 cm  LV e' medial:    4.03 cm/s LV PW:         1.29 cm  LV E/e' medial:  40.7 LV IVS:        0.79 cm  LV e' lateral:   8.38 cm/s LVOT diam:     2.00 cm  LV E/e' lateral: 19.6 LV SV:         34 LV SV Index:   18 LVOT Area:     3.14 cm  RIGHT VENTRICLE            IVC RV Basal diam:  4.15 cm    IVC diam: 2.79 cm RV Mid diam:    2.47 cm RV S prime:     8.92 cm/s TAPSE (M-mode): 2.1 cm LEFT ATRIUM              Index       RIGHT ATRIUM           Index LA diam:        4.70 cm  2.47 cm/m  RA Area:     30.20 cm  LA Vol (A2C):   119.0 ml 62.53 ml/m RA Volume:   102.00 ml 53.59 ml/m LA Vol (A4C):   108.0 ml 56.75 ml/m LA Biplane Vol: 114.0 ml 59.90 ml/m  AORTIC VALVE LVOT Vmax:   82.10 cm/s LVOT Vmean:  47.900 cm/s LVOT VTI:    0.109 m  AORTA Ao Root diam: 3.00 cm Ao Asc diam:  3.30 cm MITRAL VALVE                 TRICUSPID VALVE MV Area (PHT): 4.18 cm      TR Peak grad:  39.9 mmHg MV Decel Time: 182 msec      TR Vmax:        316.00 cm/s MR Peak grad:    124.5 mmHg MR Mean grad:    78.0 mmHg   SHUNTS MR Vmax:         558.00 cm/s Systemic VTI:  0.11 m MR Vmean:        412.0 cm/s  Systemic Diam: 2.00 cm MR PISA:         1.57 cm MR PISA Eff ROA: 11 mm MR PISA Radius:  0.50 cm MV E velocity: 164.00 cm/s Donato Schultz MD Electronically signed by Donato Schultz MD Signature Date/Time: 08/20/2020/10:23:07 AM    Final    VAS [place] LOWER EXTREMITY VENOUS (DVT)  Result Date: 08/21/2020  Lower Venous DVT Study Indications: Edema.  Comparison Study: No prior studies. Performing Technologist: Jean Rosenthal, RDMS  Examination Guidelines: A complete evaluation includes B-mode imaging, spectral Doppler, color Doppler, and power Doppler as needed of all accessible portions of each vessel. Bilateral testing is considered an integral part of a complete examination. Limited examinations for reoccurring indications may be performed as noted. The reflux portion of the exam is performed with the patient in reverse Trendelenburg.  +---------+---------------+---------+-----------+----------+--------------+ RIGHT    CompressibilityPhasicitySpontaneityPropertiesThrombus Aging +---------+---------------+---------+-----------+----------+--------------+ CFV      Full           Yes      Yes                                 +---------+---------------+---------+-----------+----------+--------------+ SFJ      Full                                                         +---------+---------------+---------+-----------+----------+--------------+ FV Prox  Full                                                        +---------+---------------+---------+-----------+----------+--------------+ FV Mid   Full                                                        +---------+---------------+---------+-----------+----------+--------------+ FV DistalFull                                                        +---------+---------------+---------+-----------+----------+--------------+ PFV      Full                                                        +---------+---------------+---------+-----------+----------+--------------+ POP      Full  Yes      Yes                                 +---------+---------------+---------+-----------+----------+--------------+ PTV      Full                                                        +---------+---------------+---------+-----------+----------+--------------+ PERO     Full                                                        +---------+---------------+---------+-----------+----------+--------------+   +---------+---------------+---------+-----------+----------+--------------+ LEFT     CompressibilityPhasicitySpontaneityPropertiesThrombus Aging +---------+---------------+---------+-----------+----------+--------------+ CFV      Full           Yes      Yes                                 +---------+---------------+---------+-----------+----------+--------------+ SFJ      Full                                                        +---------+---------------+---------+-----------+----------+--------------+ FV Prox  Full                                                        +---------+---------------+---------+-----------+----------+--------------+ FV Mid   Full                                                         +---------+---------------+---------+-----------+----------+--------------+ FV DistalFull                                                        +---------+---------------+---------+-----------+----------+--------------+ PFV      Full                                                        +---------+---------------+---------+-----------+----------+--------------+ POP      Full           Yes      Yes                                 +---------+---------------+---------+-----------+----------+--------------+ PTV  Full                                                        +---------+---------------+---------+-----------+----------+--------------+ PERO     Full                                                        +---------+---------------+---------+-----------+----------+--------------+     Summary: RIGHT: - There is no evidence of deep vein thrombosis in the lower extremity.  - No cystic structure found in the popliteal fossa.  LEFT: - There is no evidence of deep vein thrombosis in the lower extremity.  - No cystic structure found in the popliteal fossa.  *See table(s) above for measurements and observations. Electronically signed by Coral Else MD on 08/21/2020 at 7:31:02 AM.    Final    US Abdomen Limited RUQ (LIVER/GB)  Result Date: 08/20/2020 CLINICAL DATA:  Cirrhosis EXAM: ULTRASOUND ABDOMEN LIMITED RIGHT UPPER QUADRANT COMPARISON:  None. FINDINGS: Gallbladder: Cholecystectomy. Common bile duct: Diameter: 4.1 mm, within normal limits. Liver: No focal lesion identified. Within normal limits in parenchymal echogenicity. Portal vein is patent on color Doppler imaging with normal direction of blood flow towards the liver. IMPRESSION: No acute abnormality. Electronically Signed   By: Feliberto Harts MD   On: 08/20/2020 07:54    Impression:  1.  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 2.  Iron deficiency anemia without overt GI symptoms.  Plan:  1.  Case  discussed directly with Dr. Caleb Popp [facility]) and Dr. Flora Lipps (cardiology).  We will make arrangements for outpatient endoscopy/colonoscopy, which we will coordinate over the next few weeks. 2.  [facility] will sign-off; please call with questions.   LOS: 1 day   Claudie Rathbone M  08/21/2020, 11:08 AM  Cell [phone] If no answer or after 5 PM call [phone]

## 2020-08-22 ENCOUNTER — Encounter (HOSPITAL_COMMUNITY)
Admission: EM | Disposition: A | Discharge status: Home / Self Care | Payer: Self-pay | Source: Home / Self Care | Attending: Family Medicine

## 2020-08-22 HISTORY — PX: RIGHT/LEFT HEART CATH AND CORONARY ANGIOGRAPHY: CATH118266

## 2020-08-22 LAB — POCT I-STAT 7, (LYTES, BLD GAS, ICA,H+H)
Acid-Base Excess: 6 mmol/L — ABNORMAL HIGH (ref 0.0–2.0)
Bicarbonate: 31.2 mmol/L — ABNORMAL HIGH (ref 20.0–28.0)
Calcium, Ion: 1.13 mmol/L — ABNORMAL LOW (ref 1.15–1.40)
HCT: 32 % — ABNORMAL LOW (ref 36.0–46.0)
Hemoglobin: 10.9 g/dL — ABNORMAL LOW (ref 12.0–15.0)
O2 Saturation: 92 %
Potassium: 3.7 mmol/L (ref 3.5–5.1)
Sodium: 141 mmol/L (ref 135–145)
TCO2: 33 mmol/L — ABNORMAL HIGH (ref 22–32)
pCO2 arterial: 46.8 mmHg (ref 32.0–48.0)
pH, Arterial: 7.433 (ref 7.350–7.450)
pO2, Arterial: 62 mmHg — ABNORMAL LOW (ref 83.0–108.0)

## 2020-08-22 LAB — POCT I-STAT EG7
Acid-Base Excess: 7 mmol/L — ABNORMAL HIGH (ref 0.0–2.0)
Bicarbonate: 32.6 mmol/L — ABNORMAL HIGH (ref 20.0–28.0)
Calcium, Ion: 1.19 mmol/L (ref 1.15–1.40)
HCT: 32 % — ABNORMAL LOW (ref 36.0–46.0)
Hemoglobin: 10.9 g/dL — ABNORMAL LOW (ref 12.0–15.0)
O2 Saturation: 61 %
Potassium: 3.8 mmol/L (ref 3.5–5.1)
Sodium: 141 mmol/L (ref 135–145)
TCO2: 34 mmol/L — ABNORMAL HIGH (ref 22–32)
pCO2, Ven: 51.2 mmHg (ref 44.0–60.0)
pH, Ven: 7.412 (ref 7.250–7.430)
pO2, Ven: 32 mmHg (ref 32.0–45.0)

## 2020-08-22 LAB — CBC
HCT: 29.4 % — ABNORMAL LOW (ref 36.0–46.0)
Hemoglobin: 8.6 g/dL — ABNORMAL LOW (ref 12.0–15.0)
MCH: 20.1 pg — ABNORMAL LOW (ref 26.0–34.0)
MCHC: 29.3 g/dL — ABNORMAL LOW (ref 30.0–36.0)
MCV: 68.9 fL — ABNORMAL LOW (ref 80.0–100.0)
Platelets: 369 10*3/uL (ref 150–400)
RBC: 4.27 MIL/uL (ref 3.87–5.11)
RDW: 20.5 % — ABNORMAL HIGH (ref 11.5–15.5)
WBC: 4.4 10*3/uL (ref 4.0–10.5)
nRBC: 0 % (ref 0.0–0.2)

## 2020-08-22 LAB — BASIC METABOLIC PANEL
Anion gap: 8 (ref 5–15)
BUN: 13 mg/dL (ref 6–20)
CO2: 29 mmol/L (ref 22–32)
Calcium: 8.6 mg/dL — ABNORMAL LOW (ref 8.9–10.3)
Chloride: 100 mmol/L (ref 98–111)
Creatinine, Ser: 0.82 mg/dL (ref 0.44–1.00)
GFR, Estimated: >60 mL/min (ref >60)
Glucose, Bld: 92 mg/dL (ref 70–99)
Potassium: 4.4 mmol/L (ref 3.5–5.1)
Sodium: 137 mmol/L (ref 135–145)

## 2020-08-22 LAB — HAPTOGLOBIN: Haptoglobin: 105 mg/dL (ref 33–278)

## 2020-08-22 SURGERY — RIGHT/LEFT HEART CATH AND CORONARY ANGIOGRAPHY
Anesthesia: LOCAL

## 2020-08-22 MED ORDER — CARVEDILOL 25 MG PO TABS
25.0000 mg | ORAL_TABLET | Freq: Two times a day (BID) | ORAL | Status: DC
  Administered 2020-08-22 – 2020-08-23 (×2): 25 mg via ORAL
  Filled 2020-08-22 (×2): qty 1

## 2020-08-22 MED ORDER — LIDOCAINE HCL (PF) 1 % IJ SOLN
INTRAMUSCULAR | Status: DC | PRN
  Administered 2020-08-22 (×2): 2 mL

## 2020-08-22 MED ORDER — HEPARIN SODIUM (PORCINE) 1000 UNIT/ML IJ SOLN
INTRAMUSCULAR | Status: AC
  Filled 2020-08-22: qty 1

## 2020-08-22 MED ORDER — ACETAMINOPHEN 325 MG PO TABS: 650.0000 mg | ORAL_TABLET | ORAL | Status: DC | PRN

## 2020-08-22 MED ORDER — HEPARIN (PORCINE) IN NACL 1000-0.9 UT/500ML-% IV SOLN
INTRAVENOUS | Status: AC
  Filled 2020-08-22: qty 1000

## 2020-08-22 MED ORDER — HEPARIN (PORCINE) IN NACL 1000-0.9 UT/500ML-% IV SOLN
INTRAVENOUS | Status: DC | PRN
  Administered 2020-08-22 (×2): 500 mL

## 2020-08-22 MED ORDER — VERAPAMIL HCL 2.5 MG/ML IV SOLN
INTRAVENOUS | Status: AC
  Filled 2020-08-22: qty 2

## 2020-08-22 MED ORDER — HYDRALAZINE HCL 20 MG/ML IJ SOLN: 10.0000 mg | INTRAMUSCULAR | Status: DC | PRN

## 2020-08-22 MED ORDER — LIDOCAINE HCL (PF) 1 % IJ SOLN
INTRAMUSCULAR | Status: AC
  Filled 2020-08-22: qty 30

## 2020-08-22 MED ORDER — MIDAZOLAM HCL 2 MG/2ML IJ SOLN
INTRAMUSCULAR | Status: DC | PRN
  Administered 2020-08-22 (×2): 1 mg via INTRAVENOUS

## 2020-08-22 MED ORDER — LABETALOL HCL 5 MG/ML IV SOLN: 10.0000 mg | INTRAVENOUS | Status: AC | PRN

## 2020-08-22 MED ORDER — VERAPAMIL HCL 2.5 MG/ML IV SOLN: INTRAVENOUS | Status: DC | PRN

## 2020-08-22 MED ORDER — FENTANYL CITRATE (PF) 100 MCG/2ML IJ SOLN
INTRAMUSCULAR | Status: DC | PRN
  Administered 2020-08-22: 25 ug via INTRAVENOUS

## 2020-08-22 MED ORDER — HEPARIN SODIUM (PORCINE) 1000 UNIT/ML IJ SOLN
INTRAMUSCULAR | Status: DC | PRN
  Administered 2020-08-22: 3500 [IU] via INTRAVENOUS

## 2020-08-22 MED ORDER — SODIUM CHLORIDE 0.9% FLUSH
3.0000 mL | Freq: Two times a day (BID) | INTRAVENOUS | Status: DC
  Administered 2020-08-22 – 2020-08-23 (×2): 3 mL via INTRAVENOUS

## 2020-08-22 MED ORDER — SODIUM CHLORIDE 0.9% FLUSH: 3.0000 mL | INTRAVENOUS | Status: DC | PRN

## 2020-08-22 MED ORDER — SODIUM CHLORIDE 0.9 % IV SOLN: 250.0000 mL | INTRAVENOUS | Status: DC | PRN

## 2020-08-22 MED ORDER — MIDAZOLAM HCL 2 MG/2ML IJ SOLN
INTRAMUSCULAR | Status: AC
  Filled 2020-08-22: qty 2

## 2020-08-22 MED ORDER — ONDANSETRON HCL 4 MG/2ML IJ SOLN: 4.0000 mg | Freq: Four times a day (QID) | INTRAMUSCULAR | Status: DC | PRN

## 2020-08-22 MED ORDER — FENTANYL CITRATE (PF) 100 MCG/2ML IJ SOLN
INTRAMUSCULAR | Status: AC
  Filled 2020-08-22: qty 2

## 2020-08-22 MED ORDER — IOHEXOL 350 MG/ML SOLN
INTRAVENOUS | Status: DC | PRN
  Administered 2020-08-22: 45 mL

## 2020-08-22 SURGICAL SUPPLY — 12 items
CATH 5FR JL3.5 JR4 ANG PIG MP (CATHETERS) ×1 IMPLANT
CATH BALLN WEDGE 5F 110CM (CATHETERS) ×1 IMPLANT
DEVICE RAD COMP TR BAND LRG (VASCULAR PRODUCTS) ×1 IMPLANT
ELECT DEFIB PAD ADLT CADENCE (PAD) ×1 IMPLANT
GLIDESHEATH SLEND SS 6F .021 (SHEATH) ×1 IMPLANT
GUIDEWIRE INQWIRE 1.5J.035X260 (WIRE) IMPLANT
INQWIRE 1.5J .035X260CM (WIRE) ×2
KIT HEART LEFT (KITS) ×2 IMPLANT
PACK CARDIAC CATHETERIZATION (CUSTOM PROCEDURE TRAY) ×2 IMPLANT
SHEATH GLIDE SLENDER 4/5FR (SHEATH) ×1 IMPLANT
TRANSDUCER W/STOPCOCK (MISCELLANEOUS) ×2 IMPLANT
TUBING CIL FLEX 10 FLL-RA (TUBING) ×2 IMPLANT

## 2020-08-22 NOTE — Progress Notes (Signed)
PROGRESS NOTE    MIQUELLE HUNKELE  BPP:[id number] DOB: 02/14/1981 DOA: 08/19/2020 PCP: Patient, No Pcp Per   Brief Narrative: TALEYA MCDONALD is a 39 y.o. female with a history of tobacco and alcohol use. She presented secondary to bilateral leg swelling and found to have acute systolic heart failure. Lasix IV started. Cardiology consulted for management.   Assessment & Plan:   Principal Problem:   Acute CHF (congestive heart failure) (HCC) Active Problems:   Microcytic hypochromic anemia   Alcohol abuse   Tobacco abuse   Hypertension   Acute systolic heart failure (HCC)   Acute combined systolic and diastolic heart failure Unknown etiology but possibly secondary to long-standing hypertension vs acute coronary event. No recent stressors. Cardiology consulted and plan heart catheterization. Weight on admission of 80.7 kg. Weight of 71.4 kg today. UOP of only 3.4 L over the last 24 hours. -Cardiology recommendations: heart catheterization on 11/12, Lasix IV, Entresto/Coreg/Lasaix/Spironolactone -Telemetry  Hypertension Likely long-standing. Started on Entresto, Coreg, Lasix and spironolactone for above. Blood pressure better controlled today -Continue Entresto, Coreg, spironolactone/lasix  LE edema Secondary to heart failure. Some improvement since treating heart failure with Lasix -Elevation when sitting/lying down -Lasix per cardiology  Tobacco/alcohol use Counseled to quit. Patient seems agreeable. Husband does not drink alcohol anymore so hopefully this will be an easy transition.  Iron deficiency anemia Menstruating female; has a history of menorrhagia in the past but not currently. No PCP follow-up in the past. Does not appear to be acute secondary to lack of symptomatology. Reticulocyte count is normal which would be consistent with iron deficiency. Family history of sickle cell disease/trait. Vitamin B12/folate normal. Microcytosis on blood smear. -IV iron  prior to discharge -FOBT ordered on admission and pending -GI planning outpatient workup as needed   DVT prophylaxis: SCDs Code Status:   Code Status: Full Code Family Communication: None at bedside Disposition Plan: Discharge home possibly in 24 hours pending cardiology recommendations/management   Consultants:   Cardiology  Gastroenterology  Procedures:   TRANSTHORACIC ECHOCARDIOGRAM (08/20/2020) IMPRESSIONS    1. Left ventricular ejection fraction, by estimation, is 25 to 30%. The  left ventricle has severely decreased function. The left ventricle  demonstrates global hypokinesis. The left ventricular internal cavity size  was moderately dilated. Left  ventricular diastolic parameters are consistent with Grade III diastolic  dysfunction (restrictive). Elevated left atrial pressure.  2. Right ventricular systolic function is normal. The right ventricular  size is normal. There is moderately elevated pulmonary artery systolic  pressure. The estimated right ventricular systolic pressure is 54.9 mmHg.  3. Left atrial size was severely dilated.  4. Right atrial size was moderately dilated.  5. A small pericardial effusion is present. The pericardial effusion is  circumferential. There is no evidence of cardiac tamponade.  6. The mitral valve is normal in structure. Moderate to severe mitral  valve regurgitation. No evidence of mitral stenosis.  7. The aortic valve is normal in structure. Aortic valve regurgitation is  not visualized. No aortic stenosis is present.  8. The inferior vena cava is dilated in size with <50% respiratory  variability, suggesting right atrial pressure of 15 mmHg.   Antimicrobials:  None    Subjective: Breathing better. Able to lie more flat in bed. Swelling improved and her feet look much more normal.  Objective: Vitals:   08/21/20 1153 08/21/20 2021 08/22/20 0318 08/22/20 0321  BP: 128/84 114/75  123/90  Pulse: 80 73  76  Resp:  20  16  17  Temp: 98.4 F (36.9 C) 98.3 F (36.8 C)  98.1 F (36.7 C)  TempSrc: Oral Oral  Oral  SpO2: 100% 100%  99%  Weight:   71.4 kg   Height:        Intake/Output Summary (Last 24 hours) at 08/22/2020 0933 Last data filed at 08/22/2020 0319 Gross per 24 hour  Intake 420 ml  Output 3400 ml  Net -2980 ml   Filed Weights   08/21/20 0030 08/21/20 0134 08/22/20 0318  Weight: 77 kg 77 kg 71.4 kg    Examination:  General exam: Appears calm and comfortable Respiratory system: Mild bibasilar rales Respiratory effort normal. Cardiovascular system: S1 & S2 heard, RRR. No murmurs, rubs, gallops or clicks. Gastrointestinal system: Abdomen is nondistended, soft and nontender. No organomegaly or masses felt. Normal bowel sounds heard. Central nervous system: Alert and oriented. No focal neurological deficits. Musculoskeletal: Trace LE edema. No calf tenderness Skin: No cyanosis. No rashes Psychiatry: Judgement and insight appear normal. Mood & affect appropriate.     Data Reviewed: I have personally reviewed following labs and imaging studies  CBC Lab Results  Component Value Date   WBC 4.4 08/22/2020   RBC 4.27 08/22/2020   HGB 8.6 (L) 08/22/2020   HCT 29.4 (L) 08/22/2020   MCV 68.9 (L) 08/22/2020   MCH 20.1 (L) 08/22/2020   PLT 369 08/22/2020   MCHC 29.3 (L) 08/22/2020   RDW 20.5 (H) 08/22/2020   LYMPHSABS 1.0 08/19/2020   MONOABS 0.4 08/19/2020   EOSABS 0.0 08/19/2020   BASOSABS 0.0 08/19/2020     Last metabolic panel Lab Results  Component Value Date   NA 137 08/22/2020   K 4.4 08/22/2020   CL 100 08/22/2020   CO2 29 08/22/2020   BUN 13 08/22/2020   CREATININE 0.82 08/22/2020   GLUCOSE 92 08/22/2020   GFRNONAA >60 08/22/2020   CALCIUM 8.6 (L) 08/22/2020   PHOS 3.9 08/21/2020   PROT 6.5 08/21/2020   ALBUMIN 3.4 (L) 08/21/2020   BILITOT 0.6 08/21/2020   ALKPHOS 88 08/21/2020   AST 28 08/21/2020   ALT 27 08/21/2020   ANIONGAP 8 08/22/2020    CBG  (last 3)  No results for input(s): GLUCAP in the last 72 hours.   GFR: Estimated Creatinine Clearance: 92 mL/min (by C-G formula based on SCr of 0.82 mg/dL).  Coagulation Profile: Recent Labs  Lab 08/20/20 0420  INR 1.3*    Recent Results (from the past 240 hour(s))  Respiratory Panel by RT PCR (Flu A&B, Covid) - Nasopharyngeal Swab     Status: None   Collection Time: 08/20/20  4:19 AM   Specimen: Nasopharyngeal Swab  Result Value Ref Range Status   SARS Coronavirus 2 by RT PCR NEGATIVE NEGATIVE Final    Comment: (NOTE) SARS-CoV-2 target nucleic acids are NOT DETECTED.  The SARS-CoV-2 RNA is generally detectable in upper respiratoy specimens during the acute phase of infection. The lowest concentration of SARS-CoV-2 viral copies this assay can detect is 131 copies/mL. A negative result does not preclude SARS-Cov-2 infection and should not be used as the sole basis for treatment or other patient management decisions. A negative result may occur with  improper specimen collection/handling, submission of specimen other than nasopharyngeal swab, presence of viral mutation(s) within the areas targeted by this assay, and inadequate number of viral copies (<131 copies/mL). A negative result must be combined with clinical observations, patient history, and epidemiological information. The expected result is Negative.  Fact  Sheet for Patients:  https://www.moore.com/  Fact Sheet for Healthcare Providers:  https://www.young.biz/  This test is no t yet approved or cleared by the [place] FDA and  has been authorized for detection and/or diagnosis of SARS-CoV-2 by FDA under an Emergency Use Authorization (EUA). This EUA will remain  in effect (meaning this test can be used) for the duration of the COVID-19 declaration under Section 564(b)(1) of the Act, 21 U.S.C. section 360bbb-3(b)(1), unless the authorization is terminated or revoked  sooner.     Influenza A by PCR NEGATIVE NEGATIVE Final   Influenza B by PCR NEGATIVE NEGATIVE Final    Comment: (NOTE) The Xpert Xpress SARS-CoV-2/FLU/RSV assay is intended as an aid in  the diagnosis of influenza from Nasopharyngeal swab specimens and  should not be used as a sole basis for treatment. Nasal washings and  aspirates are unacceptable for Xpert Xpress SARS-CoV-2/FLU/RSV  testing.  Fact Sheet for Patients: https://www.moore.com/  Fact Sheet for Healthcare Providers: https://www.young.biz/  This test is not yet approved or cleared by the [place] FDA and  has been authorized for detection and/or diagnosis of SARS-CoV-2 by  FDA under an Emergency Use Authorization (EUA). This EUA will remain  in effect (meaning this test can be used) for the duration of the  Covid-19 declaration under Section 564(b)(1) of the Act, 21  U.S.C. section 360bbb-3(b)(1), unless the authorization is  terminated or revoked. Performed at [facility], [address] [place], [place] [address]         Radiology Studies: VAS [place] LOWER EXTREMITY VENOUS (DVT)  Result Date: 08/21/2020  Lower Venous DVT Study Indications: Edema.  Comparison Study: No prior studies. Performing Technologist: Jean Rosenthal, RDMS  Examination Guidelines: A complete evaluation includes B-mode imaging, spectral Doppler, color Doppler, and power Doppler as needed of all accessible portions of each vessel. Bilateral testing is considered an integral part of a complete examination. Limited examinations for reoccurring indications may be performed as noted. The reflux portion of the exam is performed with the patient in reverse Trendelenburg.  +---------+---------------+---------+-----------+----------+--------------+ RIGHT    CompressibilityPhasicitySpontaneityPropertiesThrombus Aging +---------+---------------+---------+-----------+----------+--------------+ CFV       Full           Yes      Yes                                 +---------+---------------+---------+-----------+----------+--------------+ SFJ      Full                                                        +---------+---------------+---------+-----------+----------+--------------+ FV Prox  Full                                                        +---------+---------------+---------+-----------+----------+--------------+ FV Mid   Full                                                        +---------+---------------+---------+-----------+----------+--------------+  FV DistalFull                                                        +---------+---------------+---------+-----------+----------+--------------+ PFV      Full                                                        +---------+---------------+---------+-----------+----------+--------------+ POP      Full           Yes      Yes                                 +---------+---------------+---------+-----------+----------+--------------+ PTV      Full                                                        +---------+---------------+---------+-----------+----------+--------------+ PERO     Full                                                        +---------+---------------+---------+-----------+----------+--------------+   +---------+---------------+---------+-----------+----------+--------------+ LEFT     CompressibilityPhasicitySpontaneityPropertiesThrombus Aging +---------+---------------+---------+-----------+----------+--------------+ CFV      Full           Yes      Yes                                 +---------+---------------+---------+-----------+----------+--------------+ SFJ      Full                                                        +---------+---------------+---------+-----------+----------+--------------+ FV Prox  Full                                                         +---------+---------------+---------+-----------+----------+--------------+ FV Mid   Full                                                        +---------+---------------+---------+-----------+----------+--------------+ FV DistalFull                                                        +---------+---------------+---------+-----------+----------+--------------+   PFV      Full                                                        +---------+---------------+---------+-----------+----------+--------------+ POP      Full           Yes      Yes                                 +---------+---------------+---------+-----------+----------+--------------+ PTV      Full                                                        +---------+---------------+---------+-----------+----------+--------------+ PERO     Full                                                        +---------+---------------+---------+-----------+----------+--------------+     Summary: RIGHT: - There is no evidence of deep vein thrombosis in the lower extremity.  - No cystic structure found in the popliteal fossa.  LEFT: - There is no evidence of deep vein thrombosis in the lower extremity.  - No cystic structure found in the popliteal fossa.  *See table(s) above for measurements and observations. Electronically signed by Coral Else MD on 08/21/2020 at 7:31:02 AM.    Final         Scheduled Meds: . carvedilol  25 mg Oral BID WC  . feeding supplement  237 mL Oral Q24H  . folic acid  1 mg Oral Daily  . furosemide  60 mg Intravenous BID  . LORazepam  0-4 mg Oral Q12H  . multivitamin with minerals  1 tablet Oral Daily  . potassium chloride  40 mEq Oral BID  . sacubitril-valsartan  1 tablet Oral BID  . sodium chloride flush  3 mL Intravenous Q12H  . spironolactone  25 mg Oral Daily  . thiamine  100 mg Oral Daily   Or  . thiamine  100 mg Intravenous Daily   Continuous  Infusions: . sodium chloride    . sodium chloride 10 mL/hr at 08/22/20 0644     LOS: 2 days    Jacquelin Hawking, MD [facility] 08/22/2020, 9:33 AM  If 7PM-7AM, please contact night-coverage [email or web address]

## 2020-08-22 NOTE — TOC Progression Note (Signed)
Transition of Care [facility]) - Progression Note    Patient Details  Name: STEPHNIE PARLIER MRN: [id number] Date of Birth: Mar 18, 1981  [facility] CM/SW Contact  Leone Haven, RN Phone Number: 08/22/2020, 4:28 PM  Clinical Narrative:    NCM assisted patient with Match Letter for medis.          Expected Discharge Plan and Services                                                 Social Determinants of Health (SDOH) Interventions    Readmission Risk Interventions No flowsheet data found.

## 2020-08-22 NOTE — Interval H&P Note (Signed)
Cath Lab Visit (complete for each Cath Lab visit)  Clinical Evaluation Leading to the Procedure:   ACS: Yes.    Non-ACS:    Anginal Classification: CCS IV  Anti-ischemic medical therapy: Minimal Therapy (1 class of medications)  Non-Invasive Test Results: High-risk stress test findings: cardiac mortality >3%/year  Prior CABG: No previous CABG      History and Physical Interval Note:  08/22/2020 2:08 PM  Isabella Sanders  has presented today for surgery, with the diagnosis of heart failure.  The various methods of treatment have been discussed with the patient and family. After consideration of risks, benefits and other options for treatment, the patient has consented to  Procedure(s): RIGHT/LEFT HEART CATH AND CORONARY ANGIOGRAPHY (N/A) as a surgical intervention.  The patient's history has been reviewed, patient examined, no change in status, stable for surgery.  I have reviewed the patient's chart and labs.  Questions were answered to the patient's satisfaction.     Lance Muss

## 2020-08-22 NOTE — H&P (View-Only) (Signed)
Cardiology Progress Note  Patient ID: Isabella Sanders MRN: [id number] DOB: 08/24/1981 Date of Encounter: 08/22/2020  Primary Cardiologist: No primary care provider on file.  Subjective   Chief Complaint: Shortness of breath  HPI: Still short of breath.  Not back to baseline.  ROS:  All other ROS reviewed and negative. Pertinent positives noted in the HPI.     Inpatient Medications  Scheduled Meds: . carvedilol  12.5 mg Oral BID WC  . feeding supplement  237 mL Oral Q24H  . folic acid  1 mg Oral Daily  . furosemide  60 mg Intravenous BID  . LORazepam  0-4 mg Oral Q12H  . multivitamin with minerals  1 tablet Oral Daily  . potassium chloride  40 mEq Oral BID  . sacubitril-valsartan  1 tablet Oral BID  . sodium chloride flush  3 mL Intravenous Q12H  . spironolactone  25 mg Oral Daily  . thiamine  100 mg Oral Daily   Or  . thiamine  100 mg Intravenous Daily   Continuous Infusions: . sodium chloride    . sodium chloride 10 mL/hr at 08/22/20 0644   PRN Meds: sodium chloride, hydrALAZINE, LORazepam **OR** LORazepam, sodium chloride flush   Vital Signs   Vitals:   08/21/20 1153 08/21/20 2021 08/22/20 0318 08/22/20 0321  BP: 128/84 114/75  123/90  Pulse: 80 73  76  Resp: 20 16  17  Temp: 98.4 F (36.9 C) 98.3 F (36.8 C)  98.1 F (36.7 C)  TempSrc: Oral Oral  Oral  SpO2: 100% 100%  99%  Weight:   71.4 kg   Height:        Intake/Output Summary (Last 24 hours) at 08/22/2020 0832 Last data filed at 08/22/2020 0319 Gross per 24 hour  Intake 670 ml  Output 3400 ml  Net -2730 ml   Last 3 Weights 08/22/2020 08/21/2020 08/21/2020  Weight (lbs) 157 lb 8 oz 169 lb 12.1 oz 169 lb 12.1 oz  Weight (kg) 71.442 kg 77 kg 77 kg      Telemetry  Overnight telemetry shows sinus rhythm with heart rate in the 60s, which I personally reviewed.   ECG  The most recent ECG shows sinus rhythm, left bundle branch block with QRS 174, which I personally reviewed.   Physical Exam    Vitals:   08/21/20 1153 08/21/20 2021 08/22/20 0318 08/22/20 0321  BP: 128/84 114/75  123/90  Pulse: 80 73  76  Resp: 20 16  17  Temp: 98.4 F (36.9 C) 98.3 F (36.8 C)  98.1 F (36.7 C)  TempSrc: Oral Oral  Oral  SpO2: 100% 100%  99%  Weight:   71.4 kg   Height:         Intake/Output Summary (Last 24 hours) at 08/22/2020 0832 Last data filed at 08/22/2020 0319 Gross per 24 hour  Intake 670 ml  Output 3400 ml  Net -2730 ml    Last 3 Weights 08/22/2020 08/21/2020 08/21/2020  Weight (lbs) 157 lb 8 oz 169 lb 12.1 oz 169 lb 12.1 oz  Weight (kg) 71.442 kg 77 kg 77 kg    Body mass index is 23.95 kg/m.  General: Well nourished, well developed, in no acute distress Head: Atraumatic, normal size  Eyes: PEERLA, EOMI  Neck: Supple, JVD 12 to 15 cm of water Endocrine: No thryomegaly Cardiac: Normal S1, S2; RRR; no murmurs, rubs, or gallops Lungs: Crackles at the lung bases Abd: Soft, nontender, no hepatomegaly  Ext: 1+ pitting edema   Musculoskeletal: No deformities, BUE and BLE strength normal and equal Skin: Warm and dry, no rashes   Neuro: Alert and oriented to person, place, time, and situation, CNII-XII grossly intact, no focal deficits  Psych: Normal mood and affect   Labs  High Sensitivity Troponin:   Recent Labs  Lab 08/20/20 1155 08/20/20 1807  TROPONINIHS 16 21*     Cardiac EnzymesNo results for input(s): TROPONINI in the last 168 hours. No results for input(s): TROPIPOC in the last 168 hours.  Chemistry Recent Labs  Lab 08/19/20 2327 08/21/20 0209 08/22/20 0535  NA 137 138 137  K 3.3* 3.9 4.4  CL 104 104 100  CO2 GLUCOSE 119* 104* 92  BUN CREATININE 0.71 0.73 0.82  CALCIUM 8.6* 8.9 8.6*  PROT 6.1* 6.5  --   ALBUMIN 3.1* 3.4*  --   AST 24 28  --   ALT 24 27  --   ALKPHOS 80 88  --   BILITOT 0.5 0.6  --   GFRNONAA >60 >60 >60  ANIONGAP Hematology Recent Labs  Lab 08/19/20 2327 08/20/20 1224 08/21/20 0209  08/22/20 0535  WBC 4.4  --  4.3 4.4  RBC 3.79* 3.80* 4.10 4.27  HGB 7.7*  --  8.3* 8.6*  HCT 26.8*  --  28.6* 29.4*  MCV 70.7*  --  69.8* 68.9*  MCH 20.3*  --  20.2* 20.1*  MCHC 28.7*  --  29.0* 29.3*  RDW 20.9*  --  20.7* 20.5*  PLT 352  --  356 369   BNP Recent Labs  Lab 08/20/20 0420  BNP 2,726.1*    DDimer No results for input(s): DDIMER in the last 168 hours.   Radiology  ECHOCARDIOGRAM COMPLETE  Result Date: 08/20/2020    ECHOCARDIOGRAM REPORT   Patient Name:   Isabella Sanders Date of Exam: 08/20/2020 Medical Rec #:  [id number]        Height:       66.0 in Accession #:    [id number]       Weight:       178.0 lb Date of Birth:  06/29/1981       BSA:          1.903 m Patient Age:    39 years         BP:           158/97 mmHg Patient Gender: F                HR:           89 bpm. Exam Location:  Inpatient Procedure: 2D Echo, Cardiac Doppler and Color Doppler                       STAT ECHO Reported to: Dr Donato Schultz on 08/20/2020 9:11:00 AM. Indications:    Dyspnea 786.09 / R06.00  History:        Patient has no prior history of Echocardiogram examinations.  Sonographer:    Elmarie Shiley Dance Referring Phys: [id number] ARSHAD N KAKRAKANDY IMPRESSIONS  1. Left ventricular ejection fraction, by estimation, is 25 to 30%. The left ventricle has severely decreased function. The left ventricle demonstrates global hypokinesis. The left ventricular internal cavity size was moderately dilated. Left ventricular diastolic parameters are consistent with Grade III diastolic dysfunction (restrictive). Elevated left atrial pressure.  2. Right ventricular systolic function is normal. The right ventricular  size is normal. There is moderately elevated pulmonary artery systolic pressure. The estimated right ventricular systolic pressure is 54.9 mmHg.  3. Left atrial size was severely dilated.  4. Right atrial size was moderately dilated.  5. A small pericardial effusion is present. The pericardial effusion is  circumferential. There is no evidence of cardiac tamponade.  6. The mitral valve is normal in structure. Moderate to severe mitral valve regurgitation. No evidence of mitral stenosis.  7. The aortic valve is normal in structure. Aortic valve regurgitation is not visualized. No aortic stenosis is present.  8. The inferior vena cava is dilated in size with <50% respiratory variability, suggesting right atrial pressure of 15 mmHg. FINDINGS  Left Ventricle: Left ventricular ejection fraction, by estimation, is 25 to 30%. The left ventricle has severely decreased function. The left ventricle demonstrates global hypokinesis. The left ventricular internal cavity size was moderately dilated. There is no left ventricular hypertrophy. Abnormal (paradoxical) septal motion, consistent with left bundle branch block. Left ventricular diastolic parameters are consistent with Grade III diastolic dysfunction (restrictive). Elevated left atrial pressure. Right Ventricle: The right ventricular size is normal. No increase in right ventricular wall thickness. Right ventricular systolic function is normal. There is moderately elevated pulmonary artery systolic pressure. The tricuspid regurgitant velocity is 3.16 m/s, and with an assumed right atrial pressure of 15 mmHg, the estimated right ventricular systolic pressure is 54.9 mmHg. Left Atrium: Left atrial size was severely dilated. Right Atrium: Right atrial size was moderately dilated. Pericardium: A small pericardial effusion is present. The pericardial effusion is circumferential. There is no evidence of cardiac tamponade. Mitral Valve: The mitral valve is normal in structure. There is mild thickening of the mitral valve leaflet(s). There is mild calcification of the mitral valve leaflet(s). Moderate to severe mitral valve regurgitation, with eccentric laterally directed jet. No evidence of mitral valve stenosis. Tricuspid Valve: The tricuspid valve is normal in structure. Tricuspid  valve regurgitation is mild . No evidence of tricuspid stenosis. Aortic Valve: The aortic valve is normal in structure. Aortic valve regurgitation is not visualized. No aortic stenosis is present. Pulmonic Valve: The pulmonic valve was normal in structure. Pulmonic valve regurgitation is mild to moderate. No evidence of pulmonic stenosis. Aorta: The aortic root is normal in size and structure. Venous: The inferior vena cava is dilated in size with less than 50% respiratory variability, suggesting right atrial pressure of 15 mmHg. IAS/Shunts: No atrial level shunt detected by color flow Doppler.  LEFT VENTRICLE PLAX 2D LVIDd:         6.78 cm  Diastology LVIDs:         5.82 cm  LV e' medial:    4.03 cm/s LV PW:         1.29 cm  LV E/e' medial:  40.7 LV IVS:        0.79 cm  LV e' lateral:   8.38 cm/s LVOT diam:     2.00 cm  LV E/e' lateral: 19.6 LV SV:         34 LV SV Index:   18 LVOT Area:     3.14 cm  RIGHT VENTRICLE            IVC RV Basal diam:  4.15 cm    IVC diam: 2.79 cm RV Mid diam:    2.47 cm RV S prime:     8.92 cm/s TAPSE (M-mode): 2.1 cm LEFT ATRIUM  Index       RIGHT ATRIUM           Index LA diam:        4.70 cm  2.47 cm/m  RA Area:     30.20 cm LA Vol (A2C):   119.0 ml 62.53 ml/m RA Volume:   102.00 ml 53.59 ml/m LA Vol (A4C):   108.0 ml 56.75 ml/m LA Biplane Vol: 114.0 ml 59.90 ml/m  AORTIC VALVE LVOT Vmax:   82.10 cm/s LVOT Vmean:  47.900 cm/s LVOT VTI:    0.109 m  AORTA Ao Root diam: 3.00 cm Ao Asc diam:  3.30 cm MITRAL VALVE                 TRICUSPID VALVE MV Area (PHT): 4.18 cm      TR Peak grad:   39.9 mmHg MV Decel Time: 182 msec      TR Vmax:        316.00 cm/s MR Peak grad:    124.5 mmHg MR Mean grad:    78.0 mmHg   SHUNTS MR Vmax:         558.00 cm/s Systemic VTI:  0.11 m MR Vmean:        412.0 cm/s  Systemic Diam: 2.00 cm MR PISA:         1.57 cm MR PISA Eff ROA: 11 mm MR PISA Radius:  0.50 cm MV E velocity: 164.00 cm/s Donato Schultz MD Electronically signed by Donato Schultz  MD Signature Date/Time: 08/20/2020/10:23:07 AM    Final    VAS [place] LOWER EXTREMITY VENOUS (DVT)  Result Date: 08/21/2020  Lower Venous DVT Study Indications: Edema.  Comparison Study: No prior studies. Performing Technologist: Jean Rosenthal, RDMS  Examination Guidelines: A complete evaluation includes B-mode imaging, spectral Doppler, color Doppler, and power Doppler as needed of all accessible portions of each vessel. Bilateral testing is considered an integral part of a complete examination. Limited examinations for reoccurring indications may be performed as noted. The reflux portion of the exam is performed with the patient in reverse Trendelenburg.  +---------+---------------+---------+-----------+----------+--------------+ RIGHT    CompressibilityPhasicitySpontaneityPropertiesThrombus Aging +---------+---------------+---------+-----------+----------+--------------+ CFV      Full           Yes      Yes                                 +---------+---------------+---------+-----------+----------+--------------+ SFJ      Full                                                        +---------+---------------+---------+-----------+----------+--------------+ FV Prox  Full                                                        +---------+---------------+---------+-----------+----------+--------------+ FV Mid   Full                                                        +---------+---------------+---------+-----------+----------+--------------+  FV DistalFull                                                        +---------+---------------+---------+-----------+----------+--------------+ PFV      Full                                                        +---------+---------------+---------+-----------+----------+--------------+ POP      Full           Yes      Yes                                  +---------+---------------+---------+-----------+----------+--------------+ PTV      Full                                                        +---------+---------------+---------+-----------+----------+--------------+ PERO     Full                                                        +---------+---------------+---------+-----------+----------+--------------+   +---------+---------------+---------+-----------+----------+--------------+ LEFT     CompressibilityPhasicitySpontaneityPropertiesThrombus Aging +---------+---------------+---------+-----------+----------+--------------+ CFV      Full           Yes      Yes                                 +---------+---------------+---------+-----------+----------+--------------+ SFJ      Full                                                        +---------+---------------+---------+-----------+----------+--------------+ FV Prox  Full                                                        +---------+---------------+---------+-----------+----------+--------------+ FV Mid   Full                                                        +---------+---------------+---------+-----------+----------+--------------+ FV DistalFull                                                        +---------+---------------+---------+-----------+----------+--------------+  PFV      Full                                                        +---------+---------------+---------+-----------+----------+--------------+ POP      Full           Yes      Yes                                 +---------+---------------+---------+-----------+----------+--------------+ PTV      Full                                                        +---------+---------------+---------+-----------+----------+--------------+ PERO     Full                                                         +---------+---------------+---------+-----------+----------+--------------+     Summary: RIGHT: - There is no evidence of deep vein thrombosis in the lower extremity.  - No cystic structure found in the popliteal fossa.  LEFT: - There is no evidence of deep vein thrombosis in the lower extremity.  - No cystic structure found in the popliteal fossa.  *See table(s) above for measurements and observations. Electronically signed by Coral Else MD on 08/21/2020 at 7:31:02 AM.    Final     Cardiac Studies  TTE 08/20/2020 1. Left ventricular ejection fraction, by estimation, is 25 to 30%. The  left ventricle has severely decreased function. The left ventricle  demonstrates global hypokinesis. The left ventricular internal cavity size  was moderately dilated. Left  ventricular diastolic parameters are consistent with Grade III diastolic  dysfunction (restrictive). Elevated left atrial pressure.  2. Right ventricular systolic function is normal. The right ventricular  size is normal. There is moderately elevated pulmonary artery systolic  pressure. The estimated right ventricular systolic pressure is 54.9 mmHg.  3. Left atrial size was severely dilated.  4. Right atrial size was moderately dilated.  5. A small pericardial effusion is present. The pericardial effusion is  circumferential. There is no evidence of cardiac tamponade.  6. The mitral valve is normal in structure. Moderate to severe mitral  valve regurgitation. No evidence of mitral stenosis.  7. The aortic valve is normal in structure. Aortic valve regurgitation is  not visualized. No aortic stenosis is present.  8. The inferior vena cava is dilated in size with <50% respiratory  variability, suggesting right atrial pressure of 15 mmHg.   Patient Profile  Isabella Sanders is a 39 y.o. female with history of tobacco abuse, hypertension who was admitted on 08/20/2020 with volume overload and new onset systolic heart  failure.  Assessment & Plan   1.  New onset systolic heart failure, EF 25-30% -TSH normal. -HIV negative. -A1c 5.3. -She did have uncontrolled hypertension.  Possibly this is the etiology. -She also has a very wide left bundle branch block with QRS 174 ms. -  Risk factors for CAD include smoking and uncontrolled hypertension.  She also has a strong family history in her sister who had an MI in her 30s. -Overall, I suspect this is related to hypertension and significant LV dyssynchrony in the setting of a left bundle branch block with QRS of 174 ms.  Although her blood pressure elevated she does not have that significant of increased wall thickness to be solely explained by hypertensive heart disease. -She is responding to IV diuretic therapy.  We will go ahead with left heart catheterization today.  She will also get a right heart catheterization today.  I would like to exclude significant CAD. -The risk and benefits of left and right heart catheterization were explained today.  These include bleeding, damage to internal structures, infection, myocardial infarction, stroke or death.  She is willing to proceed despite these risks. -Blood pressure much improved on Coreg 12.5 mg twice daily.  She is on Entresto 49 to 51 mg twice daily.  Patient assistance forms were submitted.  She is also on Aldactone 25 mg daily. -Increase Coreg to 25 mg twice daily. -Still volume up.  Continue with Lasix 60 mg IV twice daily.  We are proceeding with cath in anticipation that she will be discharged on the weekend.  2.  Iron deficiency anemia -Continue with iron supplementation.  GI will plan for outpatient work-up.  No concerns for bleeding.  Suspect this is related to heavy menses.  For questions or updates, please contact [facility] Please consult [email or web address] for contact info under   Time Spent with Patient: I have spent a total of 35 minutes with patient reviewing hospital notes, telemetry, EKGs, labs  and examining the patient as well as establishing an assessment and plan that was discussed with the patient.  > 50% of time was spent in direct patient care.    Signed, Lenna Gilford. Flora Lipps, MD [facility]  [facility]  08/22/2020 8:32 AM

## 2020-08-22 NOTE — Progress Notes (Signed)
  ReDS Clip Diuretic Study Pt study # [id number]  Your patient has been enrolled in the ReDS Clip Diuretic Study  ReDS reading much improved today 37% down from 41% yesterday and 51% on admission.  Goal is < 35% SCr stable 0.8 Urine output documented net -3L   Changes to prescribed diuretics recommended:  Agree with 1 more day lasix 60 mg IV BID Provider contacted: Dr. Flora Lipps Recommendation was accepted by provider.    REDS Clip  READING= 37%  CHEST RULER = 31 Clip Station = C   Orthodema score = 1 Signs/Symptoms Score   Mild edema, no orthopnea 0 No congestion  Moderate edema, no orthopnea 1 Low-grade orthodema/congestion  Severe edema OR orthopnea 2   Moderate edema and orthopnea 3 High-grade orthodema/congestion  Severe edema AND orthopnea 4     Leota Sauers Pharm.D. CPP, BCPS Clinical Pharmacist [phone] 08/22/2020 10:13 AM     Please check [email or web address] for unit-specific pharmacist phone numbers

## 2020-08-22 NOTE — Progress Notes (Signed)
Cardiology Progress Note  Patient ID: Isabella Sanders MRN: [id number] DOB: 02-08-1981 Date of Encounter: 08/22/2020  Primary Cardiologist: No primary care provider on file.  Subjective   Chief Complaint: Shortness of breath  HPI: Still short of breath.  Not back to baseline.  ROS:  All other ROS reviewed and negative. Pertinent positives noted in the HPI.     Inpatient Medications  Scheduled Meds: . carvedilol  12.5 mg Oral BID WC  . feeding supplement  237 mL Oral Q24H  . folic acid  1 mg Oral Daily  . furosemide  60 mg Intravenous BID  . LORazepam  0-4 mg Oral Q12H  . multivitamin with minerals  1 tablet Oral Daily  . potassium chloride  40 mEq Oral BID  . sacubitril-valsartan  1 tablet Oral BID  . sodium chloride flush  3 mL Intravenous Q12H  . spironolactone  25 mg Oral Daily  . thiamine  100 mg Oral Daily   Or  . thiamine  100 mg Intravenous Daily   Continuous Infusions: . sodium chloride    . sodium chloride 10 mL/hr at 08/22/20 0644   PRN Meds: sodium chloride, hydrALAZINE, LORazepam **OR** LORazepam, sodium chloride flush   Vital Signs   Vitals:   08/21/20 1153 08/21/20 2021 08/22/20 0318 08/22/20 0321  BP: 128/84 114/75  123/90  Pulse: 80 73  76  Resp: Temp: 98.4 F (36.9 C) 98.3 F (36.8 C)  98.1 F (36.7 C)  TempSrc: Oral Oral  Oral  SpO2: 100% 100%  99%  Weight:   71.4 kg   Height:        Intake/Output Summary (Last 24 hours) at 08/22/2020 0832 Last data filed at 08/22/2020 0319 Gross per 24 hour  Intake 670 ml  Output 3400 ml  Net -2730 ml   Last 3 Weights 08/22/2020 08/21/2020 08/21/2020  Weight (lbs) 157 lb 8 oz 169 lb 12.1 oz 169 lb 12.1 oz  Weight (kg) 71.442 kg 77 kg 77 kg      Telemetry  Overnight telemetry shows sinus rhythm with heart rate in the 60s, which I personally reviewed.   ECG  The most recent ECG shows sinus rhythm, left bundle branch block with QRS 174, which I personally reviewed.   Physical Exam    Vitals:   08/21/20 1153 08/21/20 2021 08/22/20 0318 08/22/20 0321  BP: 128/84 114/75  123/90  Pulse: 80 73  76  Resp: Temp: 98.4 F (36.9 C) 98.3 F (36.8 C)  98.1 F (36.7 C)  TempSrc: Oral Oral  Oral  SpO2: 100% 100%  99%  Weight:   71.4 kg   Height:         Intake/Output Summary (Last 24 hours) at 08/22/2020 0832 Last data filed at 08/22/2020 0319 Gross per 24 hour  Intake 670 ml  Output 3400 ml  Net -2730 ml    Last 3 Weights 08/22/2020 08/21/2020 08/21/2020  Weight (lbs) 157 lb 8 oz 169 lb 12.1 oz 169 lb 12.1 oz  Weight (kg) 71.442 kg 77 kg 77 kg    Body mass index is 23.95 kg/m.  General: Well nourished, well developed, in no acute distress Head: Atraumatic, normal size  Eyes: PEERLA, EOMI  Neck: Supple, JVD 12 to 15 cm of water Endocrine: No thryomegaly Cardiac: Normal S1, S2; RRR; no murmurs, rubs, or gallops Lungs: Crackles at the lung bases Abd: Soft, nontender, no hepatomegaly  Ext: 1+ pitting edema  Musculoskeletal: No deformities, BUE and BLE strength normal and equal Skin: Warm and dry, no rashes   Neuro: Alert and oriented to person, place, time, and situation, CNII-XII grossly intact, no focal deficits  Psych: Normal mood and affect   Labs  High Sensitivity Troponin:   Recent Labs  Lab 08/20/20 1155 08/20/20 1807  TROPONINIHS 16 21*     Cardiac EnzymesNo results for input(s): TROPONINI in the last 168 hours. No results for input(s): TROPIPOC in the last 168 hours.  Chemistry Recent Labs  Lab 08/19/20 2327 08/21/20 0209 08/22/20 0535  NA 137 138 137  K 3.3* 3.9 4.4  CL 104 104 100  CO2 GLUCOSE 119* 104* 92  BUN CREATININE 0.71 0.73 0.82  CALCIUM 8.6* 8.9 8.6*  PROT 6.1* 6.5  --   ALBUMIN 3.1* 3.4*  --   AST 24 28  --   ALT 24 27  --   ALKPHOS 80 88  --   BILITOT 0.5 0.6  --   GFRNONAA >60 >60 >60  ANIONGAP Hematology Recent Labs  Lab 08/19/20 2327 08/20/20 1224 08/21/20 0209  08/22/20 0535  WBC 4.4  --  4.3 4.4  RBC 3.79* 3.80* 4.10 4.27  HGB 7.7*  --  8.3* 8.6*  HCT 26.8*  --  28.6* 29.4*  MCV 70.7*  --  69.8* 68.9*  MCH 20.3*  --  20.2* 20.1*  MCHC 28.7*  --  29.0* 29.3*  RDW 20.9*  --  20.7* 20.5*  PLT 352  --  356 369   BNP Recent Labs  Lab 08/20/20 0420  BNP 2,726.1*    DDimer No results for input(s): DDIMER in the last 168 hours.   Radiology  ECHOCARDIOGRAM COMPLETE  Result Date: 08/20/2020    ECHOCARDIOGRAM REPORT   Patient Name:   Isabella Sanders Date of Exam: 08/20/2020 Medical Rec #:  [id number]        Height:       66.0 in Accession #:    [id number]       Weight:       178.0 lb Date of Birth:  06/29/1981       BSA:          1.903 m Patient Age:    39 years         BP:           158/97 mmHg Patient Gender: F                HR:           89 bpm. Exam Location:  Inpatient Procedure: 2D Echo, Cardiac Doppler and Color Doppler                       STAT ECHO Reported to: Dr Donato Schultz on 08/20/2020 9:11:00 AM. Indications:    Dyspnea 786.09 / R06.00  History:        Patient has no prior history of Echocardiogram examinations.  Sonographer:    Elmarie Shiley Dance Referring Phys: [id number] ARSHAD N KAKRAKANDY IMPRESSIONS  1. Left ventricular ejection fraction, by estimation, is 25 to 30%. The left ventricle has severely decreased function. The left ventricle demonstrates global hypokinesis. The left ventricular internal cavity size was moderately dilated. Left ventricular diastolic parameters are consistent with Grade III diastolic dysfunction (restrictive). Elevated left atrial pressure.  2. Right ventricular systolic function is normal. The right ventricular  size is normal. There is moderately elevated pulmonary artery systolic pressure. The estimated right ventricular systolic pressure is 54.9 mmHg.  3. Left atrial size was severely dilated.  4. Right atrial size was moderately dilated.  5. A small pericardial effusion is present. The pericardial effusion is  circumferential. There is no evidence of cardiac tamponade.  6. The mitral valve is normal in structure. Moderate to severe mitral valve regurgitation. No evidence of mitral stenosis.  7. The aortic valve is normal in structure. Aortic valve regurgitation is not visualized. No aortic stenosis is present.  8. The inferior vena cava is dilated in size with <50% respiratory variability, suggesting right atrial pressure of 15 mmHg. FINDINGS  Left Ventricle: Left ventricular ejection fraction, by estimation, is 25 to 30%. The left ventricle has severely decreased function. The left ventricle demonstrates global hypokinesis. The left ventricular internal cavity size was moderately dilated. There is no left ventricular hypertrophy. Abnormal (paradoxical) septal motion, consistent with left bundle branch block. Left ventricular diastolic parameters are consistent with Grade III diastolic dysfunction (restrictive). Elevated left atrial pressure. Right Ventricle: The right ventricular size is normal. No increase in right ventricular wall thickness. Right ventricular systolic function is normal. There is moderately elevated pulmonary artery systolic pressure. The tricuspid regurgitant velocity is 3.16 m/s, and with an assumed right atrial pressure of 15 mmHg, the estimated right ventricular systolic pressure is 54.9 mmHg. Left Atrium: Left atrial size was severely dilated. Right Atrium: Right atrial size was moderately dilated. Pericardium: A small pericardial effusion is present. The pericardial effusion is circumferential. There is no evidence of cardiac tamponade. Mitral Valve: The mitral valve is normal in structure. There is mild thickening of the mitral valve leaflet(s). There is mild calcification of the mitral valve leaflet(s). Moderate to severe mitral valve regurgitation, with eccentric laterally directed jet. No evidence of mitral valve stenosis. Tricuspid Valve: The tricuspid valve is normal in structure. Tricuspid  valve regurgitation is mild . No evidence of tricuspid stenosis. Aortic Valve: The aortic valve is normal in structure. Aortic valve regurgitation is not visualized. No aortic stenosis is present. Pulmonic Valve: The pulmonic valve was normal in structure. Pulmonic valve regurgitation is mild to moderate. No evidence of pulmonic stenosis. Aorta: The aortic root is normal in size and structure. Venous: The inferior vena cava is dilated in size with less than 50% respiratory variability, suggesting right atrial pressure of 15 mmHg. IAS/Shunts: No atrial level shunt detected by color flow Doppler.  LEFT VENTRICLE PLAX 2D LVIDd:         6.78 cm  Diastology LVIDs:         5.82 cm  LV e' medial:    4.03 cm/s LV PW:         1.29 cm  LV E/e' medial:  40.7 LV IVS:        0.79 cm  LV e' lateral:   8.38 cm/s LVOT diam:     2.00 cm  LV E/e' lateral: 19.6 LV SV:         34 LV SV Index:   18 LVOT Area:     3.14 cm  RIGHT VENTRICLE            IVC RV Basal diam:  4.15 cm    IVC diam: 2.79 cm RV Mid diam:    2.47 cm RV S prime:     8.92 cm/s TAPSE (M-mode): 2.1 cm LEFT ATRIUM  Index       RIGHT ATRIUM           Index LA diam:        4.70 cm  2.47 cm/m  RA Area:     30.20 cm LA Vol (A2C):   119.0 ml 62.53 ml/m RA Volume:   102.00 ml 53.59 ml/m LA Vol (A4C):   108.0 ml 56.75 ml/m LA Biplane Vol: 114.0 ml 59.90 ml/m  AORTIC VALVE LVOT Vmax:   82.10 cm/s LVOT Vmean:  47.900 cm/s LVOT VTI:    0.109 m  AORTA Ao Root diam: 3.00 cm Ao Asc diam:  3.30 cm MITRAL VALVE                 TRICUSPID VALVE MV Area (PHT): 4.18 cm      TR Peak grad:   39.9 mmHg MV Decel Time: 182 msec      TR Vmax:        316.00 cm/s MR Peak grad:    124.5 mmHg MR Mean grad:    78.0 mmHg   SHUNTS MR Vmax:         558.00 cm/s Systemic VTI:  0.11 m MR Vmean:        412.0 cm/s  Systemic Diam: 2.00 cm MR PISA:         1.57 cm MR PISA Eff ROA: 11 mm MR PISA Radius:  0.50 cm MV E velocity: 164.00 cm/s Donato Schultz MD Electronically signed by Donato Schultz  MD Signature Date/Time: 08/20/2020/10:23:07 AM    Final    VAS [place] LOWER EXTREMITY VENOUS (DVT)  Result Date: 08/21/2020  Lower Venous DVT Study Indications: Edema.  Comparison Study: No prior studies. Performing Technologist: Jean Rosenthal, RDMS  Examination Guidelines: A complete evaluation includes B-mode imaging, spectral Doppler, color Doppler, and power Doppler as needed of all accessible portions of each vessel. Bilateral testing is considered an integral part of a complete examination. Limited examinations for reoccurring indications may be performed as noted. The reflux portion of the exam is performed with the patient in reverse Trendelenburg.  +---------+---------------+---------+-----------+----------+--------------+ RIGHT    CompressibilityPhasicitySpontaneityPropertiesThrombus Aging +---------+---------------+---------+-----------+----------+--------------+ CFV      Full           Yes      Yes                                 +---------+---------------+---------+-----------+----------+--------------+ SFJ      Full                                                        +---------+---------------+---------+-----------+----------+--------------+ FV Prox  Full                                                        +---------+---------------+---------+-----------+----------+--------------+ FV Mid   Full                                                        +---------+---------------+---------+-----------+----------+--------------+  FV DistalFull                                                        +---------+---------------+---------+-----------+----------+--------------+ PFV      Full                                                        +---------+---------------+---------+-----------+----------+--------------+ POP      Full           Yes      Yes                                  +---------+---------------+---------+-----------+----------+--------------+ PTV      Full                                                        +---------+---------------+---------+-----------+----------+--------------+ PERO     Full                                                        +---------+---------------+---------+-----------+----------+--------------+   +---------+---------------+---------+-----------+----------+--------------+ LEFT     CompressibilityPhasicitySpontaneityPropertiesThrombus Aging +---------+---------------+---------+-----------+----------+--------------+ CFV      Full           Yes      Yes                                 +---------+---------------+---------+-----------+----------+--------------+ SFJ      Full                                                        +---------+---------------+---------+-----------+----------+--------------+ FV Prox  Full                                                        +---------+---------------+---------+-----------+----------+--------------+ FV Mid   Full                                                        +---------+---------------+---------+-----------+----------+--------------+ FV DistalFull                                                        +---------+---------------+---------+-----------+----------+--------------+  PFV      Full                                                        +---------+---------------+---------+-----------+----------+--------------+ POP      Full           Yes      Yes                                 +---------+---------------+---------+-----------+----------+--------------+ PTV      Full                                                        +---------+---------------+---------+-----------+----------+--------------+ PERO     Full                                                         +---------+---------------+---------+-----------+----------+--------------+     Summary: RIGHT: - There is no evidence of deep vein thrombosis in the lower extremity.  - No cystic structure found in the popliteal fossa.  LEFT: - There is no evidence of deep vein thrombosis in the lower extremity.  - No cystic structure found in the popliteal fossa.  *See table(s) above for measurements and observations. Electronically signed by Coral Else MD on 08/21/2020 at 7:31:02 AM.    Final     Cardiac Studies  TTE 08/20/2020 1. Left ventricular ejection fraction, by estimation, is 25 to 30%. The  left ventricle has severely decreased function. The left ventricle  demonstrates global hypokinesis. The left ventricular internal cavity size  was moderately dilated. Left  ventricular diastolic parameters are consistent with Grade III diastolic  dysfunction (restrictive). Elevated left atrial pressure.  2. Right ventricular systolic function is normal. The right ventricular  size is normal. There is moderately elevated pulmonary artery systolic  pressure. The estimated right ventricular systolic pressure is 54.9 mmHg.  3. Left atrial size was severely dilated.  4. Right atrial size was moderately dilated.  5. A small pericardial effusion is present. The pericardial effusion is  circumferential. There is no evidence of cardiac tamponade.  6. The mitral valve is normal in structure. Moderate to severe mitral  valve regurgitation. No evidence of mitral stenosis.  7. The aortic valve is normal in structure. Aortic valve regurgitation is  not visualized. No aortic stenosis is present.  8. The inferior vena cava is dilated in size with <50% respiratory  variability, suggesting right atrial pressure of 15 mmHg.   Patient Profile  Isabella Sanders is a 39 y.o. female with history of tobacco abuse, hypertension who was admitted on 08/20/2020 with volume overload and new onset systolic heart  failure.  Assessment & Plan   1.  New onset systolic heart failure, EF 25-30% -TSH normal. -HIV negative. -A1c 5.3. -She did have uncontrolled hypertension.  Possibly this is the etiology. -She also has a very wide left bundle branch block with QRS 174 ms. -  Risk factors for CAD include smoking and uncontrolled hypertension.  She also has a strong family history in her sister who had an MI in her 30s. -Overall, I suspect this is related to hypertension and significant LV dyssynchrony in the setting of a left bundle branch block with QRS of 174 ms.  Although her blood pressure elevated she does not have that significant of increased wall thickness to be solely explained by hypertensive heart disease. -She is responding to IV diuretic therapy.  We will go ahead with left heart catheterization today.  She will also get a right heart catheterization today.  I would like to exclude significant CAD. -The risk and benefits of left and right heart catheterization were explained today.  These include bleeding, damage to internal structures, infection, myocardial infarction, stroke or death.  She is willing to proceed despite these risks. -Blood pressure much improved on Coreg 12.5 mg twice daily.  She is on Entresto 49 to 51 mg twice daily.  Patient assistance forms were submitted.  She is also on Aldactone 25 mg daily. -Increase Coreg to 25 mg twice daily. -Still volume up.  Continue with Lasix 60 mg IV twice daily.  We are proceeding with cath in anticipation that she will be discharged on the weekend.  2.  Iron deficiency anemia -Continue with iron supplementation.  GI will plan for outpatient work-up.  No concerns for bleeding.  Suspect this is related to heavy menses.  For questions or updates, please contact [facility] Please consult [email or web address] for contact info under   Time Spent with Patient: I have spent a total of 35 minutes with patient reviewing hospital notes, telemetry, EKGs, labs  and examining the patient as well as establishing an assessment and plan that was discussed with the patient.  > 50% of time was spent in direct patient care.    Signed, Lenna Gilford. Flora Lipps, MD [facility]  [facility]  08/22/2020 8:32 AM

## 2020-08-23 LAB — BASIC METABOLIC PANEL
Anion gap: 8 (ref 5–15)
BUN: 12 mg/dL (ref 6–20)
CO2: 28 mmol/L (ref 22–32)
Calcium: 8.4 mg/dL — ABNORMAL LOW (ref 8.9–10.3)
Chloride: 101 mmol/L (ref 98–111)
Creatinine, Ser: 0.78 mg/dL (ref 0.44–1.00)
GFR, Estimated: >60 mL/min (ref >60)
Glucose, Bld: 98 mg/dL (ref 70–99)
Potassium: 3.9 mmol/L (ref 3.5–5.1)
Sodium: 137 mmol/L (ref 135–145)

## 2020-08-23 MED ORDER — DIGOXIN 125 MCG PO TABS
0.1250 mg | ORAL_TABLET | Freq: Every day | ORAL | Status: DC
  Administered 2020-08-23: 0.125 mg via ORAL
  Filled 2020-08-23: qty 1

## 2020-08-23 MED ORDER — SPIRONOLACTONE 12.5 MG HALF TABLET
12.5000 mg | ORAL_TABLET | Freq: Every day | ORAL | Status: DC
  Administered 2020-08-23: 12.5 mg via ORAL

## 2020-08-23 MED ORDER — CARVEDILOL 25 MG PO TABS
25.0000 mg | ORAL_TABLET | Freq: Two times a day (BID) | ORAL | 0 refills | Status: DC
Start: 2020-08-23 — End: 2020-09-18

## 2020-08-23 MED ORDER — POTASSIUM CHLORIDE CRYS ER 20 MEQ PO TBCR
20.0000 meq | EXTENDED_RELEASE_TABLET | Freq: Two times a day (BID) | ORAL | 0 refills | Status: DC
Start: 2020-08-23 — End: 2020-09-18

## 2020-08-23 MED ORDER — SACUBITRIL-VALSARTAN 49-51 MG PO TABS
1.0000 | ORAL_TABLET | Freq: Two times a day (BID) | ORAL | 0 refills | Status: DC
Start: 2020-08-23 — End: 2020-09-18

## 2020-08-23 MED ORDER — ENSURE ENLIVE PO LIQD
237.0000 mL | ORAL | Status: DC
Start: 2020-08-23 — End: 2020-12-04

## 2020-08-23 MED ORDER — POTASSIUM CHLORIDE CRYS ER 20 MEQ PO TBCR
20.0000 meq | EXTENDED_RELEASE_TABLET | Freq: Two times a day (BID) | ORAL | Status: DC
  Administered 2020-08-23: 20 meq via ORAL
  Filled 2020-08-23: qty 1

## 2020-08-23 MED ORDER — FUROSEMIDE 40 MG PO TABS
40.0000 mg | ORAL_TABLET | Freq: Two times a day (BID) | ORAL | 0 refills | Status: DC
Start: 2020-08-23 — End: 2020-09-18

## 2020-08-23 MED ORDER — SPIRONOLACTONE 25 MG PO TABS
12.5000 mg | ORAL_TABLET | Freq: Every day | ORAL | 0 refills | Status: DC
Start: 2020-08-23 — End: 2020-09-18

## 2020-08-23 MED ORDER — FUROSEMIDE 40 MG PO TABS: 40.0000 mg | ORAL_TABLET | Freq: Two times a day (BID) | ORAL | Status: DC

## 2020-08-23 MED ORDER — DIGOXIN 125 MCG PO TABS
0.1250 mg | ORAL_TABLET | Freq: Every day | ORAL | 0 refills | Status: DC
Start: 2020-08-23 — End: 2020-09-18

## 2020-08-23 NOTE — Discharge Summary (Signed)
Physician Discharge Summary  RUCHEL Isabella Sanders:[id number] DOB: [birth year] DOA: 08/19/2020  PCP: Patient, No Pcp Per  Admit date: 08/19/2020 Discharge date: 08/23/2020  Admitted From: Home Disposition: Home  Recommendations for Outpatient Follow-up:  1. Follow up with PCP in 1 week 2. Follow up with cardiology in two weeks 3. Please obtain BMP/CBC in one week 4. Please follow up on the following pending results: None   Discharge Condition: Stable CODE STATUS: Full code Diet recommendation: Heart healthy   Brief/Interim Summary:  Admission HPI written by Eduard Clos, MD   Chief Complaint: Lower extremity edema.  HPI: Isabella Sanders is a 39 y.o. female with history of tobacco and alcohol abuse presents to the ER because of worsening lower extremity IMA noticed over the last couple of days.  Denies any shortness of breath but does have nonproductive cough which has been persistent for the last 2 weeks.  Has been taking over-the-counter Mucinex.  Denies fever chills nausea vomiting diarrhea admits to drinking alcohol every day also smokes tobacco.  Patient states her younger sister died at age 35 while in training in the [employer] from sudden cardiac death.  Hospital course:  Acute combined systolic and diastolic heart failure Unknown etiology but possibly secondary to long-standing hypertension vs acute coronary event. Transthoracic Echocardiogram significant for an EF of 25-30% with grade III diastolic dysfunction. No recent stressors. Cardiology consulted. Weight on admission of 80.7 kg. Weight of 67.6 kg on day of discharge. L/R heart catheterization significant for non-ischemic cardiomyopathy. Patient started on Entresto/Coreg/Lasaix/Spironolactone which were prescribed on discharge.  Hypertension Likely long-standing. Started on Entresto, Coreg, Lasix and spironolactone for above. Blood pressure better controlled today. Continue Entresto, Coreg,  spironolactone/lasix.  LE edema Secondary to heart failure. Improved with diuresis.  Tobacco/alcohol use Counseled to quit. Patient seems agreeable. Husband does not drink alcohol anymore so hopefully this will be an easy transition.  Iron deficiency anemia Menstruating female; has a history of menorrhagia in the past but not currently. No PCP follow-up in the past. Does not appear to be acute secondary to lack of symptomatology. Reticulocyte count is normal which would be consistent with iron deficiency. Family history of sickle cell disease/trait. Vitamin B12/folate normal. Microcytosis on blood smear. Outpatient follow-up.  Discharge Diagnoses:  Principal Problem:   Acute CHF (congestive heart failure) (HCC) Active Problems:   Microcytic hypochromic anemia   Alcohol abuse   Tobacco abuse   Hypertension   Acute systolic heart failure [facility])    Discharge Instructions  Discharge Instructions    Call MD for:  difficulty breathing, headache or visual disturbances   Complete by: As directed    Call MD for:  severe uncontrolled pain   Complete by: As directed    Diet - low sodium heart healthy   Complete by: As directed    Increase activity slowly   Complete by: As directed      Allergies as of 08/23/2020   No Known Allergies     Medication List    STOP taking these medications   hydrOXYzine 25 MG tablet Commonly known as: ATARAX/VISTARIL   naproxen sodium 220 MG tablet Commonly known as: ALEVE     TAKE these medications   carvedilol 25 MG tablet Commonly known as: COREG Take 1 tablet (25 mg total) by mouth 2 (two) times daily with a meal.   digoxin 0.125 MG tablet Commonly known as: LANOXIN Take 1 tablet (0.125 mg total) by mouth daily.   feeding supplement  Liqd Take 237 mLs by mouth daily.   furosemide 40 MG tablet Commonly known as: LASIX Take 1 tablet (40 mg total) by mouth 2 (two) times daily.   guaiFENesin 600 MG 12 hr tablet Commonly known as:  MUCINEX Take 600 mg by mouth 2 (two) times daily as needed.   potassium chloride SA 20 MEQ tablet Commonly known as: KLOR-CON Take 1 tablet (20 mEq total) by mouth 2 (two) times daily. Take with your Lasix.   sacubitril-valsartan 49-51 MG Commonly known as: ENTRESTO Take 1 tablet by mouth 2 (two) times daily.   spironolactone 25 MG tablet Commonly known as: ALDACTONE Take 0.5 tablets (12.5 mg total) by mouth daily.       Follow-up Information    Azalee Course, [place] Follow up.   Specialties: Cardiology, Radiology Why: Hospital follow-up scheduled for 09/01/2020 at 2:45pm with Azalee Course, one of Dr. Marylene Buerger PAs. Please arrive 15 minutes early for check-in. If this date/time does not work for you, please call our office to reschedule. Contact information: [address] [place] [address] [phone]        [facility]. Go on 09/18/2020.   Why: @2 :30pm Contact information: [address] [place] [facility] [id number] [phone]             No Known Allergies  Consultations:  Cardiology   Procedures/Studies: DG Chest 2 View  Result Date: 08/20/2020 CLINICAL DATA:  Shortness of breath EXAM: CHEST - 2 VIEW COMPARISON:  None. FINDINGS: Marked cardiopericardial enlargement. Interstitial opacity with [employer]. No visible effusion or pneumothorax. Cholecystectomy clips. IMPRESSION: 1. CHF. 2. Marked cardiopericardial enlargement globular heart shape, pericardial effusion could be present. Electronically Signed   By: Marnee Spring M.D.   On: 08/20/2020 05:01   DG Pelvis 1-2 Views  Result Date: 08/20/2020 CLINICAL DATA:  Bilateral lower extremity swelling for 2 days. EXAM: PELVIS - 1-2 VIEW COMPARISON:  None. FINDINGS: Both hips are normally located. No significant degenerative changes or acute bony abnormality. No plain film evidence of avascular necrosis. The pubic symphysis and SI joints are intact. No pelvic  fractures or bone lesions. Age advanced vascular calcifications noted. IMPRESSION: No acute bony findings. Electronically Signed   By: Rudie Meyer M.D.   On: 08/20/2020 06:49   CARDIAC CATHETERIZATION  Result Date: 08/22/2020  There is severe left ventricular systolic dysfunction.  The left ventricular ejection fraction is less than 25% by visual estimate.  LV end diastolic pressure is moderately elevated.  There is no aortic valve stenosis.  No angiographically apparent CAD.  Ao sat 92%, PA sat 61%, PA pressure 33/13, mean 25 mm Hg; mean PCWP 16 mm Hg; CO6.7 L/min; CI 3.6.  Normal right heart pressures.  Continue medical therapy for nonischemic cardiomyopathy.   ECHOCARDIOGRAM COMPLETE  Result Date: 08/20/2020    ECHOCARDIOGRAM REPORT   Patient Name:   Isabella Sanders Date of Exam: 08/20/2020 Medical Rec #:  [id number]        Height:       66.0 in Accession #:    [id number]       Weight:       178.0 lb Date of Birth:  02/04/1981       BSA:          1.903 m Patient Age:    39 years         BP:           158/97 mmHg Patient Gender:  F                HR:           89 bpm. Exam Location:  Inpatient Procedure: 2D Echo, Cardiac Doppler and Color Doppler                       STAT ECHO Reported to: Dr Donato Schultz on 08/20/2020 9:11:00 AM. Indications:    Dyspnea 786.09 / R06.00  History:        Patient has no prior history of Echocardiogram examinations.  Sonographer:    Elmarie Shiley Dance Referring Phys: [id number] ARSHAD N KAKRAKANDY IMPRESSIONS  1. Left ventricular ejection fraction, by estimation, is 25 to 30%. The left ventricle has severely decreased function. The left ventricle demonstrates global hypokinesis. The left ventricular internal cavity size was moderately dilated. Left ventricular diastolic parameters are consistent with Grade III diastolic dysfunction (restrictive). Elevated left atrial pressure.  2. Right ventricular systolic function is normal. The right ventricular size is normal. There is  moderately elevated pulmonary artery systolic pressure. The estimated right ventricular systolic pressure is 54.9 mmHg.  3. Left atrial size was severely dilated.  4. Right atrial size was moderately dilated.  5. A small pericardial effusion is present. The pericardial effusion is circumferential. There is no evidence of cardiac tamponade.  6. The mitral valve is normal in structure. Moderate to severe mitral valve regurgitation. No evidence of mitral stenosis.  7. The aortic valve is normal in structure. Aortic valve regurgitation is not visualized. No aortic stenosis is present.  8. The inferior vena cava is dilated in size with <50% respiratory variability, suggesting right atrial pressure of 15 mmHg. FINDINGS  Left Ventricle: Left ventricular ejection fraction, by estimation, is 25 to 30%. The left ventricle has severely decreased function. The left ventricle demonstrates global hypokinesis. The left ventricular internal cavity size was moderately dilated. There is no left ventricular hypertrophy. Abnormal (paradoxical) septal motion, consistent with left bundle branch block. Left ventricular diastolic parameters are consistent with Grade III diastolic dysfunction (restrictive). Elevated left atrial pressure. Right Ventricle: The right ventricular size is normal. No increase in right ventricular wall thickness. Right ventricular systolic function is normal. There is moderately elevated pulmonary artery systolic pressure. The tricuspid regurgitant velocity is 3.16 m/s, and with an assumed right atrial pressure of 15 mmHg, the estimated right ventricular systolic pressure is 54.9 mmHg. Left Atrium: Left atrial size was severely dilated. Right Atrium: Right atrial size was moderately dilated. Pericardium: A small pericardial effusion is present. The pericardial effusion is circumferential. There is no evidence of cardiac tamponade. Mitral Valve: The mitral valve is normal in structure. There is mild thickening of  the mitral valve leaflet(s). There is mild calcification of the mitral valve leaflet(s). Moderate to severe mitral valve regurgitation, with eccentric laterally directed jet. No evidence of mitral valve stenosis. Tricuspid Valve: The tricuspid valve is normal in structure. Tricuspid valve regurgitation is mild . No evidence of tricuspid stenosis. Aortic Valve: The aortic valve is normal in structure. Aortic valve regurgitation is not visualized. No aortic stenosis is present. Pulmonic Valve: The pulmonic valve was normal in structure. Pulmonic valve regurgitation is mild to moderate. No evidence of pulmonic stenosis. Aorta: The aortic root is normal in size and structure. Venous: The inferior vena cava is dilated in size with less than 50% respiratory variability, suggesting right atrial pressure of 15 mmHg. IAS/Shunts: No atrial level shunt detected by color flow Doppler.  LEFT  VENTRICLE PLAX 2D LVIDd:         6.78 cm  Diastology LVIDs:         5.82 cm  LV e' medial:    4.03 cm/s LV PW:         1.29 cm  LV E/e' medial:  40.7 LV IVS:        0.79 cm  LV e' lateral:   8.38 cm/s LVOT diam:     2.00 cm  LV E/e' lateral: 19.6 LV SV:         34 LV SV Index:   18 LVOT Area:     3.14 cm  RIGHT VENTRICLE            IVC RV Basal diam:  4.15 cm    IVC diam: 2.79 cm RV Mid diam:    2.47 cm RV S prime:     8.92 cm/s TAPSE (M-mode): 2.1 cm LEFT ATRIUM              Index       RIGHT ATRIUM           Index LA diam:        4.70 cm  2.47 cm/m  RA Area:     30.20 cm LA Vol (A2C):   119.0 ml 62.53 ml/m RA Volume:   102.00 ml 53.59 ml/m LA Vol (A4C):   108.0 ml 56.75 ml/m LA Biplane Vol: 114.0 ml 59.90 ml/m  AORTIC VALVE LVOT Vmax:   82.10 cm/s LVOT Vmean:  47.900 cm/s LVOT VTI:    0.109 m  AORTA Ao Root diam: 3.00 cm Ao Asc diam:  3.30 cm MITRAL VALVE                 TRICUSPID VALVE MV Area (PHT): 4.18 cm      TR Peak grad:   39.9 mmHg MV Decel Time: 182 msec      TR Vmax:        316.00 cm/s MR Peak grad:    124.5 mmHg MR Mean  grad:    78.0 mmHg   SHUNTS MR Vmax:         558.00 cm/s Systemic VTI:  0.11 m MR Vmean:        412.0 cm/s  Systemic Diam: 2.00 cm MR PISA:         1.57 cm MR PISA Eff ROA: 11 mm MR PISA Radius:  0.50 cm MV E velocity: 164.00 cm/s Donato Schultz MD Electronically signed by Donato Schultz MD Signature Date/Time: 08/20/2020/10:23:07 AM    Final    VAS [place] LOWER EXTREMITY VENOUS (DVT)  Result Date: 08/21/2020  Lower Venous DVT Study Indications: Edema.  Comparison Study: No prior studies. Performing Technologist: Jean Rosenthal, RDMS  Examination Guidelines: A complete evaluation includes B-mode imaging, spectral Doppler, color Doppler, and power Doppler as needed of all accessible portions of each vessel. Bilateral testing is considered an integral part of a complete examination. Limited examinations for reoccurring indications may be performed as noted. The reflux portion of the exam is performed with the patient in reverse Trendelenburg.  +---------+---------------+---------+-----------+----------+--------------+ RIGHT    CompressibilityPhasicitySpontaneityPropertiesThrombus Aging +---------+---------------+---------+-----------+----------+--------------+ CFV      Full           Yes      Yes                                 +---------+---------------+---------+-----------+----------+--------------+ SFJ  Full                                                        +---------+---------------+---------+-----------+----------+--------------+ FV Prox  Full                                                        +---------+---------------+---------+-----------+----------+--------------+ FV Mid   Full                                                        +---------+---------------+---------+-----------+----------+--------------+ FV DistalFull                                                        +---------+---------------+---------+-----------+----------+--------------+ PFV       Full                                                        +---------+---------------+---------+-----------+----------+--------------+ POP      Full           Yes      Yes                                 +---------+---------------+---------+-----------+----------+--------------+ PTV      Full                                                        +---------+---------------+---------+-----------+----------+--------------+ PERO     Full                                                        +---------+---------------+---------+-----------+----------+--------------+   +---------+---------------+---------+-----------+----------+--------------+ LEFT     CompressibilityPhasicitySpontaneityPropertiesThrombus Aging +---------+---------------+---------+-----------+----------+--------------+ CFV      Full           Yes      Yes                                 +---------+---------------+---------+-----------+----------+--------------+ SFJ      Full                                                        +---------+---------------+---------+-----------+----------+--------------+  FV Prox  Full                                                        +---------+---------------+---------+-----------+----------+--------------+ FV Mid   Full                                                        +---------+---------------+---------+-----------+----------+--------------+ FV DistalFull                                                        +---------+---------------+---------+-----------+----------+--------------+ PFV      Full                                                        +---------+---------------+---------+-----------+----------+--------------+ POP      Full           Yes      Yes                                 +---------+---------------+---------+-----------+----------+--------------+ PTV      Full                                                         +---------+---------------+---------+-----------+----------+--------------+ PERO     Full                                                        +---------+---------------+---------+-----------+----------+--------------+     Summary: RIGHT: - There is no evidence of deep vein thrombosis in the lower extremity.  - No cystic structure found in the popliteal fossa.  LEFT: - There is no evidence of deep vein thrombosis in the lower extremity.  - No cystic structure found in the popliteal fossa.  *See table(s) above for measurements and observations. Electronically signed by Coral Else MD on 08/21/2020 at 7:31:02 AM.    Final    US Abdomen Limited RUQ (LIVER/GB)  Result Date: 08/20/2020 CLINICAL DATA:  Cirrhosis EXAM: ULTRASOUND ABDOMEN LIMITED RIGHT UPPER QUADRANT COMPARISON:  None. FINDINGS: Gallbladder: Cholecystectomy. Common bile duct: Diameter: 4.1 mm, within normal limits. Liver: No focal lesion identified. Within normal limits in parenchymal echogenicity. Portal vein is patent on color Doppler imaging with normal direction of blood flow towards the liver. IMPRESSION: No acute abnormality. Electronically Signed   By: Feliberto Harts MD   On: 08/20/2020 07:54      Subjective: No chest pain or dyspnea. LE edema much  improved.  Discharge Exam: Vitals:   08/23/20 0759 08/23/20 0931  BP: 111/80 98/63  Pulse: 67 (!) 57  Resp: 16   Temp: 98.7 F (37.1 C)   SpO2: 100% 100%   Vitals:   08/23/20 0254 08/23/20 0447 08/23/20 0759 08/23/20 0931  BP:  108/69 111/80 98/63  Pulse:  73 67 (!) 57  Resp:  18 16   Temp:  98.8 F (37.1 C) 98.7 F (37.1 C)   TempSrc:  Oral Oral   SpO2:  100% 100% 100%  Weight: 67.6 kg     Height:        General: Pt is alert, awake, not in acute distress Cardiovascular: RRR, S1/S2 +, no rubs, no gallops Respiratory: CTA bilaterally, no wheezing, no rhonchi Abdominal: Soft, NT, ND, bowel sounds + Extremities: no edema, no  cyanosis    The results of significant diagnostics from this hospitalization (including imaging, microbiology, ancillary and laboratory) are listed below for reference.     Microbiology: Recent Results (from the past 240 hour(s))  Respiratory Panel by RT PCR (Flu A&B, Covid) - Nasopharyngeal Swab     Status: None   Collection Time: 08/20/20  4:19 AM   Specimen: Nasopharyngeal Swab  Result Value Ref Range Status   SARS Coronavirus 2 by RT PCR NEGATIVE NEGATIVE Final    Comment: (NOTE) SARS-CoV-2 target nucleic acids are NOT DETECTED.  The SARS-CoV-2 RNA is generally detectable in upper respiratoy specimens during the acute phase of infection. The lowest concentration of SARS-CoV-2 viral copies this assay can detect is 131 copies/mL. A negative result does not preclude SARS-Cov-2 infection and should not be used as the sole basis for treatment or other patient management decisions. A negative result may occur with  improper specimen collection/handling, submission of specimen other than nasopharyngeal swab, presence of viral mutation(s) within the areas targeted by this assay, and inadequate number of viral copies (<131 copies/mL). A negative result must be combined with clinical observations, patient history, and epidemiological information. The expected result is Negative.  Fact Sheet for Patients:  https://www.moore.com/  Fact Sheet for Healthcare Providers:  https://www.young.biz/  This test is no t yet approved or cleared by the [place] FDA and  has been authorized for detection and/or diagnosis of SARS-CoV-2 by FDA under an Emergency Use Authorization (EUA). This EUA will remain  in effect (meaning this test can be used) for the duration of the COVID-19 declaration under Section 564(b)(1) of the Act, 21 U.S.C. section 360bbb-3(b)(1), unless the authorization is terminated or revoked sooner.     Influenza A by PCR NEGATIVE  NEGATIVE Final   Influenza B by PCR NEGATIVE NEGATIVE Final    Comment: (NOTE) The Xpert Xpress SARS-CoV-2/FLU/RSV assay is intended as an aid in  the diagnosis of influenza from Nasopharyngeal swab specimens and  should not be used as a sole basis for treatment. Nasal washings and  aspirates are unacceptable for Xpert Xpress SARS-CoV-2/FLU/RSV  testing.  Fact Sheet for Patients: https://www.moore.com/  Fact Sheet for Healthcare Providers: https://www.young.biz/  This test is not yet approved or cleared by the [place] FDA and  has been authorized for detection and/or diagnosis of SARS-CoV-2 by  FDA under an Emergency Use Authorization (EUA). This EUA will remain  in effect (meaning this test can be used) for the duration of the  Covid-19 declaration under Section 564(b)(1) of the Act, 21  U.S.C. section 360bbb-3(b)(1), unless the authorization is  terminated or revoked. Performed at [facility], [address] [place], [place] [address]      Labs: BNP (last 3 results) Recent Labs    08/20/20 0420  BNP 2,726.1*   Basic Metabolic Panel: Recent Labs  Lab 08/19/20 2327 08/19/20 2327 08/20/20 1807 08/21/20 0209 08/21/20 0932 08/22/20 0535 08/22/20 1430 08/22/20 1431 08/23/20 0441  NA 137   < >  --  138  --  137 141 141 137  K 3.3*   < >  --  3.9  --  4.4 3.8 3.7 3.9  CL 104  --   --  104  --  100  --   --  101  CO2 26  --   --  25  --  29  --   --  28  GLUCOSE 119*  --   --  104*  --  92  --   --  98  BUN 11  --   --  13  --  13  --   --  12  CREATININE 0.71  --   --  0.73  --  0.82  --   --  0.78  CALCIUM 8.6*  --   --  8.9  --  8.6*  --   --  8.4*  MG  --   --  1.8  --  1.9  --   --   --   --   PHOS  --   --   --   --  3.9  --   --   --   --    < > = values in this interval not displayed.   Liver Function Tests: Recent Labs  Lab 08/19/20 2327 08/21/20 0209  AST 24 28  ALT 24 27  ALKPHOS 80 88  BILITOT  0.5 0.6  PROT 6.1* 6.5  ALBUMIN 3.1* 3.4*   No results for input(s): LIPASE, AMYLASE in the last 168 hours. No results for input(s): AMMONIA in the last 168 hours. CBC: Recent Labs  Lab 08/19/20 2327 08/21/20 0209 08/22/20 0535 08/22/20 1430 08/22/20 1431  WBC 4.4 4.3 4.4  --   --   NEUTROABS 2.9  --   --   --   --   HGB 7.7* 8.3* 8.6* 10.9* 10.9*  HCT 26.8* 28.6* 29.4* 32.0* 32.0*  MCV 70.7* 69.8* 68.9*  --   --   PLT 352 356 369  --   --    Cardiac Enzymes: No results for input(s): CKTOTAL, CKMB, CKMBINDEX, TROPONINI in the last 168 hours. BNP: Invalid input(s): POCBNP CBG: No results for input(s): GLUCAP in the last 168 hours. D-Dimer No results for input(s): DDIMER in the last 72 hours. Hgb A1c Recent Labs    08/21/20 0209  HGBA1C 5.3   Lipid Profile Recent Labs    08/21/20 0209  CHOL 122  HDL 32*  LDLCALC 82  TRIG 40  CHOLHDL 3.8   Thyroid function studies No results for input(s): TSH, T4TOTAL, T3FREE, THYROIDAB in the last 72 hours.  Invalid input(s): FREET3 Anemia work up Recent Labs    08/20/20 1224  RETICCTPCT 1.8   Urinalysis No results found for: COLORURINE, APPEARANCEUR, LABSPEC, PHURINE, GLUCOSEU, HGBUR, BILIRUBINUR, KETONESUR, PROTEINUR, UROBILINOGEN, NITRITE, LEUKOCYTESUR Sepsis Labs Invalid input(s): PROCALCITONIN,  WBC,  LACTICIDVEN Microbiology Recent Results (from the past 240 hour(s))  Respiratory Panel by RT PCR (Flu A&B, Covid) - Nasopharyngeal Swab     Status: None   Collection Time: 08/20/20  4:19 AM   Specimen: Nasopharyngeal Swab  Result Value Ref Range Status   SARS Coronavirus 2 by RT PCR NEGATIVE NEGATIVE Final    Comment: (NOTE) SARS-CoV-2 target nucleic acids are NOT DETECTED.  The SARS-CoV-2 RNA is generally detectable in upper respiratoy specimens during the acute phase of infection. The lowest concentration of SARS-CoV-2 viral copies this assay can detect is 131 copies/mL. A negative result does not preclude  SARS-Cov-2 infection and should not be used as the sole basis for treatment or other patient management decisions. A negative result may occur with  improper specimen collection/handling, submission of specimen other than nasopharyngeal swab, presence of viral mutation(s) within the areas targeted by this assay, and inadequate number of viral copies (<131 copies/mL). A negative result must be combined with clinical observations, patient history, and epidemiological information. The expected result is Negative.  Fact Sheet for Patients:  https://www.moore.com/  Fact Sheet for Healthcare Providers:  https://www.young.biz/  This test is no t yet approved or cleared by the [place] FDA and  has been authorized for detection and/or diagnosis of SARS-CoV-2 by FDA under an Emergency Use Authorization (EUA). This EUA will remain  in effect (meaning this test can be used) for the duration of the COVID-19 declaration under Section 564(b)(1) of the Act, 21 U.S.C. section 360bbb-3(b)(1), unless the authorization is terminated or revoked sooner.     Influenza A by PCR NEGATIVE NEGATIVE Final   Influenza B by PCR NEGATIVE NEGATIVE Final    Comment: (NOTE) The Xpert Xpress SARS-CoV-2/FLU/RSV assay is intended as an aid in  the diagnosis of influenza from Nasopharyngeal swab specimens and  should not be used as a sole basis for treatment. Nasal washings and  aspirates are unacceptable for Xpert Xpress SARS-CoV-2/FLU/RSV  testing.  Fact Sheet for Patients: https://www.moore.com/  Fact Sheet for Healthcare Providers: https://www.young.biz/  This test is not yet approved or cleared by the [place] FDA and  has been authorized for detection and/or diagnosis of SARS-CoV-2 by  FDA under an Emergency Use Authorization (EUA). This EUA will remain  in effect (meaning this test can be used) for the duration of the   Covid-19 declaration under Section 564(b)(1) of the Act, 21  U.S.C. section 360bbb-3(b)(1), unless the authorization is  terminated or revoked. Performed at [facility], [address], [place], [place] [address]      Time coordinating discharge: 35 minutes  SIGNED:   Jacquelin Hawking, MD [facility] 08/23/2020, 11:01 AM

## 2020-08-23 NOTE — TOC Transition Note (Signed)
Transition of Care [facility]) - CM/SW Discharge Note   Patient Details  Name: Isabella Sanders MRN: [id number] Date of Birth: 09-02-1981  Transition of Care [facility]) CM/SW Contact:  Kallie Locks, RN Phone Number: [phone] 08/23/2020, 1:00 PM   Clinical Narrative:  Received call from patient's nurse to confirm medication assistance MATCH letter is in place. Went to bedside and patient had MATCH letter and understands the process. Made patient aware that she has appointment with [facility]. No further needs assessed. Nursing aware.    Discharge Plan and Services - home self care   Raiford Noble, MSN, RN,BSN Inpatient [facility] Case Manager [phone]

## 2020-08-23 NOTE — Progress Notes (Addendum)
  ReDS Clip Diuretic Study Pt study # [id number]  Your patient has been enrolled in the ReDS Clip Diuretic Study  ReDS reading much improved today 32% down from 37% yesterday and 51% on admission.  Goal is < 35% SCr stable 0.8 Urine output documented net -3L, wt down 20lb   Changes to prescribed diuretics recommended:  Agree with changing to oral furosemide and discharge Provider contacted: Dr. Mal Misty Recommendation was accepted by provider.    REDS Clip  READING= 32%  CHEST RULER = 31 Clip Station = C   Orthodema score = 1 Signs/Symptoms Score   Mild edema, no orthopnea 0 No congestion  Moderate edema, no orthopnea 1 Low-grade orthodema/congestion  Severe edema OR orthopnea 2   Moderate edema and orthopnea 3 High-grade orthodema/congestion  Severe edema AND orthopnea 4     Isabella Sanders Pharm.D. CPP, BCPS Clinical Pharmacist [phone] 08/23/2020 10:14 AM     Please check [email or web address] for unit-specific pharmacist phone numbers

## 2020-08-23 NOTE — Progress Notes (Signed)
Patient alert and oriented denies pain VSS. Iv and tele d/c Heart failure Zone tool and d/c instruction explain and given to the patient patient verbalized understanding. Pt. D/c home per order.

## 2020-08-23 NOTE — Progress Notes (Addendum)
Progress Note  Patient Name: Isabella Sanders Date of Encounter: 08/23/2020  [facility] Cardiologist: Dr Scharlene Gloss  Subjective   No CP; No dyspnea  Inpatient Medications    Scheduled Meds:  carvedilol  25 mg Oral BID WC   feeding supplement  237 mL Oral Q24H   folic acid  1 mg Oral Daily   furosemide  60 mg Intravenous BID   LORazepam  0-4 mg Oral Q12H   multivitamin with minerals  1 tablet Oral Daily   potassium chloride  40 mEq Oral BID   sacubitril-valsartan  1 tablet Oral BID   sodium chloride flush  3 mL Intravenous Q12H   spironolactone  25 mg Oral Daily   thiamine  100 mg Oral Daily   Or   thiamine  100 mg Intravenous Daily   Continuous Infusions:  sodium chloride     PRN Meds: sodium chloride, acetaminophen, hydrALAZINE, ondansetron (ZOFRAN) IV, sodium chloride flush   Vital Signs    Vitals:   08/22/20 2047 08/23/20 0254 08/23/20 0447 08/23/20 0759  BP: 102/66  108/69 111/80  Pulse: 75  73 67  Resp: 17  18 16   Temp: 98.4 F (36.9 C)  98.8 F (37.1 C) 98.7 F (37.1 C)  TempSrc: Oral  Oral Oral  SpO2: 99%  100% 100%  Weight:  67.6 kg    Height:        Intake/Output Summary (Last 24 hours) at 08/23/2020 0807 Last data filed at 08/23/2020 0255 Gross per 24 hour  Intake 683 ml  Output 850 ml  Net -167 ml   Last 3 Weights 08/23/2020 08/22/2020 08/21/2020  Weight (lbs) 149 lb 157 lb 8 oz 169 lb 12.1 oz  Weight (kg) 67.586 kg 71.442 kg 77 kg      Telemetry    Sinus- Personally Reviewed   Physical Exam   GEN: No acute distress.   Neck: No JVD Cardiac: RRR, no murmurs, rubs, or gallops.  Respiratory: Clear to auscultation bilaterally. GI: Soft, nontender, non-distended  MS: No edema; radial cath site with no hematoma Neuro:  Nonfocal  Psych: Normal affect   Labs    High Sensitivity Troponin:   Recent Labs  Lab 08/20/20 1155 08/20/20 1807  TROPONINIHS 16 21*      Chemistry Recent Labs  Lab 08/19/20 2327  08/19/20 2327 08/21/20 0209 08/21/20 0209 08/22/20 0535 08/22/20 0535 08/22/20 1430 08/22/20 1431 08/23/20 0441  NA 137   < > 138   < > 137   < > 141 141 137  K 3.3*   < > 3.9   < > 4.4   < > 3.8 3.7 3.9  CL 104   < > 104  --  100  --   --   --  101  CO2 26   < > 25  --  29  --   --   --  28  GLUCOSE 119*   < > 104*  --  92  --   --   --  98  BUN 11   < > 13  --  13  --   --   --  12  CREATININE 0.71   < > 0.73  --  0.82  --   --   --  0.78  CALCIUM 8.6*   < > 8.9  --  8.6*  --   --   --  8.4*  PROT 6.1*  --  6.5  --   --   --   --   --   --  ALBUMIN 3.1*  --  3.4*  --   --   --   --   --   --   AST 24  --  28  --   --   --   --   --   --   ALT 24  --  27  --   --   --   --   --   --   ALKPHOS 80  --  88  --   --   --   --   --   --   BILITOT 0.5  --  0.6  --   --   --   --   --   --   GFRNONAA >60   < > >60  --  >60  --   --   --  >60  ANIONGAP 7   < > 9  --  8  --   --   --  8   < > = values in this interval not displayed.     Hematology Recent Labs  Lab 08/19/20 2327 08/19/20 2327 08/20/20 1224 08/21/20 0209 08/21/20 0209 08/22/20 0535 08/22/20 1430 08/22/20 1431  WBC 4.4  --   --  4.3  --  4.4  --   --   RBC 3.79*  --  3.80* 4.10  --  4.27  --   --   HGB 7.7*   < >  --  8.3*   < > 8.6* 10.9* 10.9*  HCT 26.8*   < >  --  28.6*   < > 29.4* 32.0* 32.0*  MCV 70.7*  --   --  69.8*  --  68.9*  --   --   MCH 20.3*  --   --  20.2*  --  20.1*  --   --   MCHC 28.7*  --   --  29.0*  --  29.3*  --   --   RDW 20.9*  --   --  20.7*  --  20.5*  --   --   PLT 352  --   --  356  --  369  --   --    < > = values in this interval not displayed.    BNP Recent Labs  Lab 08/20/20 0420  BNP 2,726.1*     Radiology    CARDIAC CATHETERIZATION  Result Date: 08/22/2020  There is severe left ventricular systolic dysfunction.  The left ventricular ejection fraction is less than 25% by visual estimate.  LV end diastolic pressure is moderately elevated.  There is no aortic  valve stenosis.  No angiographically apparent CAD.  Ao sat 92%, PA sat 61%, PA pressure 33/13, mean 25 mm Hg; mean PCWP 16 mm Hg; CO6.7 L/min; CI 3.6.  Normal right heart pressures.  Continue medical therapy for nonischemic cardiomyopathy.    Patient Profile     KIRRA VERGA is a 39 y.o. female with history of tobacco abuse, hypertension who was admitted on 08/20/2020 with volume overload and new onset systolic heart failure.  Echocardiogram November 2021 showed severe LV dysfunction with ejection fraction 25 to 30%, restrictive filling, moderate pulmonary hypertension, biatrial enlargement, small pericardial effusion, moderate to severe mitral regurgitation.  Cardiac catheterization revealed severe LV dysfunction with ejection fraction less than 25%, no coronary disease and pulmonary capillary wedge pressure 16.  Assessment & Plan    1 acute systolic congestive heart failure-I/O-667; Wt 67.6 kg.  Patient much  improved compared to admission.  Will change Lasix to 40 mg by mouth twice daily.  Change spironolactone to 12.5 mg daily.  Continue fluid restriction and low-sodium diet.  2 nonischemic cardiomyopathy-etiology unclear.  Possibly hypertensive mediated versus related to left bundle branch block and dyssynchrony.  Continue Entresto and carvedilol.  Add digoxin 0.125 mg daily.  Will titrate medications following discharge.  Repeat echocardiogram 3 months after fully titrated.  If ejection fraction less than 35% would need to consider CRT D.  3 microcytic anemia-likely secondary to menstruation.  Follow-up primary care.  4 tobacco abuse-patient has discontinued.  Patient can be discharged from a cardiac standpoint on present meds as listed in [facility].  We will arrange transition of care appointment with APP in 1 to 2 weeks.  She will need follow-up with Dr. Bufford Buttner in 8 to 12 weeks.  She will need a bmet 1 week following discharge.  Please call with questions.  > 45 min PA and physician time    For questions or updates, please contact [facility] Please consult [email or web address] for contact info under       Signed, Olga Millers, MD  08/23/2020, 8:07 AM

## 2020-08-23 NOTE — Discharge Instructions (Signed)
Isabella Sanders,  You were in the hospital because of leg swelling and found to have heart failure. You have been started on many medications and will need to follow-up with the cardiologist. I have included some resources for you to use as reference.

## 2020-08-25 ENCOUNTER — Encounter (HOSPITAL_COMMUNITY): Payer: Self-pay | Admitting: Interventional Cardiology

## 2020-08-26 ENCOUNTER — Telehealth (HOSPITAL_COMMUNITY): Payer: Self-pay

## 2020-08-26 ENCOUNTER — Encounter (HOSPITAL_COMMUNITY): Payer: Self-pay | Admitting: Emergency Medicine

## 2020-08-26 ENCOUNTER — Emergency Department (HOSPITAL_COMMUNITY)
Admission: EM | Admit: 2020-08-26 | Discharge: 2020-08-26 | Disposition: A | Discharge status: Home / Self Care | Payer: Self-pay | Attending: Emergency Medicine | Admitting: Emergency Medicine

## 2020-08-26 ENCOUNTER — Other Ambulatory Visit: Payer: Self-pay

## 2020-08-26 DIAGNOSIS — A09 Infectious gastroenteritis and colitis, unspecified: Secondary | ICD-10-CM | POA: Insufficient documentation

## 2020-08-26 DIAGNOSIS — I5023 Acute on chronic systolic (congestive) heart failure: Secondary | ICD-10-CM | POA: Insufficient documentation

## 2020-08-26 DIAGNOSIS — I5020 Unspecified systolic (congestive) heart failure: Secondary | ICD-10-CM | POA: Insufficient documentation

## 2020-08-26 DIAGNOSIS — I252 Old myocardial infarction: Secondary | ICD-10-CM | POA: Insufficient documentation

## 2020-08-26 DIAGNOSIS — F17210 Nicotine dependence, cigarettes, uncomplicated: Secondary | ICD-10-CM | POA: Insufficient documentation

## 2020-08-26 DIAGNOSIS — K529 Noninfective gastroenteritis and colitis, unspecified: Secondary | ICD-10-CM

## 2020-08-26 DIAGNOSIS — I110 Hypertensive heart disease with heart failure: Secondary | ICD-10-CM | POA: Insufficient documentation

## 2020-08-26 DIAGNOSIS — R112 Nausea with vomiting, unspecified: Secondary | ICD-10-CM | POA: Insufficient documentation

## 2020-08-26 LAB — COMPREHENSIVE METABOLIC PANEL
ALT: 20 U/L (ref 0–44)
AST: 21 U/L (ref 15–41)
Albumin: 3.3 g/dL — ABNORMAL LOW (ref 3.5–5.0)
Alkaline Phosphatase: 71 U/L (ref 38–126)
Anion gap: 7 (ref 5–15)
BUN: 10 mg/dL (ref 6–20)
CO2: 29 mmol/L (ref 22–32)
Calcium: 8.9 mg/dL (ref 8.9–10.3)
Chloride: 102 mmol/L (ref 98–111)
Creatinine, Ser: 0.91 mg/dL (ref 0.44–1.00)
GFR, Estimated: >60 mL/min (ref >60)
Glucose, Bld: 102 mg/dL — ABNORMAL HIGH (ref 70–99)
Potassium: 4.4 mmol/L (ref 3.5–5.1)
Sodium: 138 mmol/L (ref 135–145)
Total Bilirubin: 0.4 mg/dL (ref 0.3–1.2)
Total Protein: 6.5 g/dL (ref 6.5–8.1)

## 2020-08-26 LAB — CBC
HCT: 31.4 % — ABNORMAL LOW (ref 36.0–46.0)
Hemoglobin: 8.8 g/dL — ABNORMAL LOW (ref 12.0–15.0)
MCH: 20.4 pg — ABNORMAL LOW (ref 26.0–34.0)
MCHC: 28 g/dL — ABNORMAL LOW (ref 30.0–36.0)
MCV: 72.7 fL — ABNORMAL LOW (ref 80.0–100.0)
Platelets: 402 10*3/uL — ABNORMAL HIGH (ref 150–400)
RBC: 4.32 MIL/uL (ref 3.87–5.11)
RDW: 21 % — ABNORMAL HIGH (ref 11.5–15.5)
WBC: 5.1 10*3/uL (ref 4.0–10.5)
nRBC: 0 % (ref 0.0–0.2)

## 2020-08-26 LAB — URINALYSIS, ROUTINE W REFLEX MICROSCOPIC
Bacteria, UA: NONE SEEN
Bilirubin Urine: NEGATIVE
Glucose, UA: NEGATIVE mg/dL
Hgb urine dipstick: NEGATIVE
Ketones, ur: NEGATIVE mg/dL
Leukocytes,Ua: NEGATIVE
Nitrite: NEGATIVE
Protein, ur: 30 mg/dL — AB
Specific Gravity, Urine: 1.021 (ref 1.005–1.030)
pH: 6 (ref 5.0–8.0)

## 2020-08-26 LAB — LIPASE, BLOOD: Lipase: 45 U/L (ref 11–51)

## 2020-08-26 LAB — I-STAT BETA HCG BLOOD, ED (MC, WL, AP ONLY): I-stat hCG, quantitative: <5 m[IU]/mL (ref <5)

## 2020-08-26 LAB — POC OCCULT BLOOD, ED: Fecal Occult Bld: NEGATIVE

## 2020-08-26 MED ORDER — LORAZEPAM 1 MG PO TABS
0.5000 mg | ORAL_TABLET | Freq: Once | ORAL | Status: AC
  Administered 2020-08-26: 0.5 mg via ORAL
  Filled 2020-08-26: qty 1

## 2020-08-26 NOTE — Discharge Instructions (Signed)
Your work-up today was reassuring, I want you to follow-up with your GI doctor and your cardiologist as we discussed.  I think you most likely had some food poisoning symptoms, please use the attached instructions in regards for food choices to help relieve diarrhea.  If you have any new or worsening concerning symptoms please come back to the emergency department.  As we discussed your hemoglobin was low, want you to follow-up with your primary care doctor about this as well.  Please continue take your iron and multivitamins as directed.

## 2020-08-26 NOTE — Telephone Encounter (Signed)
Heart Failure Patient Advocate Encounter  Sent in [employer] Assistance application to [employer] for [employer] on 08/20/20.    [phone] Fax: [phone]  Application has been approved from 08/22/20-08/22/21.  [employer] will be contacting patient shortly to set up the first delivery of Entresto to her home.  Contacted patient and she is now aware of this update.  Sharen Hones, PharmD, BCPS Heart Failure Stewardship Pharmacist Phone [phone]

## 2020-08-26 NOTE — ED Provider Notes (Signed)
[facility] EMERGENCY DEPARTMENT Provider Note   CSN: [id number] Arrival date & time: 08/26/20  0321     History Chief Complaint  Isabella Sanders presents with  . Abdominal Pain    Isabella Sanders is a 39 y.o. Sanders with pertinent past medical history of acute systolic heart failure with a EF of 25 to 30%, hypertension, tobacco and alcohol abuse, iron deficiency anemia that presents emergency department today for diarrhea, nausea, abdominal pain that started this morning.  Isabella Sanders was recently discharged from the hospital 3 days ago due to new diagnosis of grade 3 diastolic dysfunction heart failure due to longstanding hypertension.  Was started on 6 new medications that Isabella Sanders started 3 days ago including carvedilol, digoxin, furosemide, Entresto, spironolactone and also given potassium tablets.  Isabella Sanders states that Isabella Sanders ate a sub with lettuce last night from a fast food spot, states that Isabella Sanders went to bed feeling nauseous and woke up a couple hours later with multiple rounds of diarrhea and vomiting with cramping abdominal pain that was generalized.  States that the pain was a 10/10.  Denies any hematemesis or bilious emesis.  Denies any bloody diarrhea or mucus diarrhea.  Isabella Sanders states that while Isabella Sanders has been here pain has decreased to a 4/10 with some mild nausea. This was without intervention.  Denies any fever, chills.  Isabella Sanders states that Isabella Sanders was in normal health yesterday.  Isabella Sanders was concerned because Isabella Sanders wanted to make sure that this was not coming from any of the new medications that Isabella Sanders started therefore came to the emergency department.  Isabella Sanders has been able to drink fluids and eat after this.  Has been taking all her medications as prescribed.  Denies any cough, URI symptoms, back pain, headache, chest pain or shortness of breath.  Denies any sick contacts, denies anyone else having similar symptoms. HPI     Past Medical History:  Diagnosis Date  . Tobacco abuse      Isabella Sanders Active Problem List   Diagnosis Date Noted  . Acute systolic heart failure (HCC)   . Acute CHF (congestive heart failure) (HCC) 08/20/2020  . Microcytic hypochromic anemia 08/20/2020  . Alcohol abuse 08/20/2020  . Tobacco abuse 08/20/2020  . Hypertension 08/20/2020    Past Surgical History:  Procedure Laterality Date  . CESAREAN SECTION    . CHOLECYSTECTOMY    . RIGHT/LEFT HEART CATH AND CORONARY ANGIOGRAPHY N/A 08/22/2020   Procedure: RIGHT/LEFT HEART CATH AND CORONARY ANGIOGRAPHY;  Surgeon: Corky Crafts, MD;  Location: [facility] INVASIVE CV LAB;  Service: Cardiovascular;  Laterality: N/A;     OB History   No obstetric history on file.     Family History  Problem Relation Age of Onset  . Sudden Cardiac Death Sister 49  . Heart attack Sister        "mild" heart attack in her 82's    Social History   Tobacco Use  . Smoking status: Current Every Day Smoker  . Smokeless tobacco: Never Used  Substance Use Topics  . Alcohol use: Yes    Comment: everyday.  . Drug use: Not Currently    Home Medications Prior to Admission medications   Medication Sig Start Date End Date Taking? Authorizing Provider  carvedilol (COREG) 25 MG tablet Take 1 tablet (25 mg total) by mouth 2 (two) times daily with a meal. 08/23/20   Narda Bonds, MD  digoxin (LANOXIN) 0.125 MG tablet Take 1 tablet (0.125 mg total) by mouth daily. 08/23/20  Narda Bonds, MD  feeding supplement (ENSURE ENLIVE / ENSURE PLUS) LIQD Take 237 mLs by mouth daily. 08/23/20   Narda Bonds, MD  furosemide (LASIX) 40 MG tablet Take 1 tablet (40 mg total) by mouth 2 (two) times daily. 08/23/20   Narda Bonds, MD  guaiFENesin (MUCINEX) 600 MG 12 hr tablet Take 600 mg by mouth 2 (two) times daily as needed.    [provider]  potassium chloride SA (KLOR-CON) 20 MEQ tablet Take 1 tablet (20 mEq total) by mouth 2 (two) times daily. Take with your Lasix. 08/23/20   Narda Bonds, MD   sacubitril-valsartan (ENTRESTO) 49-51 MG Take 1 tablet by mouth 2 (two) times daily. 08/23/20   Narda Bonds, MD  spironolactone (ALDACTONE) 25 MG tablet Take 0.5 tablets (12.5 mg total) by mouth daily. 08/23/20   Narda Bonds, MD    Allergies    Isabella Sanders has no known allergies.  Review of Systems   Review of Systems  Constitutional: Negative for chills, diaphoresis, fatigue and fever.  HENT: Negative for congestion, sore throat and trouble swallowing.   Eyes: Negative for pain and visual disturbance.  Respiratory: Negative for cough, shortness of breath and wheezing.   Cardiovascular: Negative for chest pain, palpitations and leg swelling.  Gastrointestinal: Positive for abdominal pain, diarrhea, nausea and vomiting. Negative for abdominal distention.  Genitourinary: Negative for difficulty urinating.  Musculoskeletal: Negative for back pain, neck pain and neck stiffness.  Skin: Negative for pallor.  Neurological: Negative for dizziness, speech difficulty, weakness and headaches.  Psychiatric/Behavioral: Negative for confusion.    Physical Exam Updated Vital Signs BP 128/78 (BP Location: Left Arm)   Pulse 61   Temp 97.8 F (36.6 C) (Oral)   Resp 14   Ht 5\' 6"  (1.676 m)   Wt 74 kg   SpO2 100%   BMI 26.33 kg/m   Physical Exam Constitutional:      General: Isabella Sanders is not in acute distress.    Appearance: Normal appearance. Isabella Sanders is not ill-appearing, toxic-appearing or diaphoretic.     Comments: Appears well  HENT:     Mouth/Throat:     Mouth: Mucous membranes are moist.     Pharynx: Oropharynx is clear.  Eyes:     General: No scleral icterus.    Extraocular Movements: Extraocular movements intact.     Pupils: Pupils are equal, round, and reactive to light.  Cardiovascular:     Rate and Rhythm: Normal rate and regular rhythm.     Pulses: Normal pulses.     Heart sounds: Normal heart sounds.  Pulmonary:     Effort: Pulmonary effort is normal. No respiratory  distress.     Breath sounds: Normal breath sounds. No stridor. No wheezing, rhonchi or rales.  Chest:     Chest wall: No tenderness.  Abdominal:     General: Abdomen is flat. There is no distension.     Palpations: Abdomen is soft.     Tenderness: There is no abdominal tenderness. There is no guarding or rebound.  Musculoskeletal:        General: No swelling or tenderness. Normal range of motion.     Cervical back: Normal range of motion and neck supple. No rigidity.     Right lower leg: No edema.     Left lower leg: No edema.  Skin:    General: Skin is warm and dry.     Capillary Refill: Capillary refill takes less than 2 seconds.  Coloration: Skin is not pale.  Neurological:     General: No focal deficit present.     Mental Status: Isabella Sanders is alert and oriented to person, place, and time.  Psychiatric:        Mood and Affect: Mood normal.        Behavior: Behavior normal.     ED Results / Procedures / Treatments   Labs (all labs ordered are listed, but only abnormal results are displayed) Labs Reviewed  COMPREHENSIVE METABOLIC PANEL - Abnormal; Notable for the following components:      Result Value   Glucose, Bld 102 (*)    Albumin 3.3 (*)    All other components within normal limits  CBC - Abnormal; Notable for the following components:   Hemoglobin 8.8 (*)    HCT 31.4 (*)    MCV 72.7 (*)    MCH 20.4 (*)    MCHC 28.0 (*)    RDW 21.0 (*)    Platelets 402 (*)    All other components within normal limits  URINALYSIS, ROUTINE W REFLEX MICROSCOPIC - Abnormal; Notable for the following components:   APPearance HAZY (*)    Protein, ur 30 (*)    All other components within normal limits  LIPASE, BLOOD  I-STAT BETA HCG BLOOD, ED ([facility], [facility], [facility] ONLY)  POC OCCULT BLOOD, ED    EKG EKG Interpretation  Date/Time:  Tuesday August 26 2020 09:05:27 EST Ventricular Rate:  50 PR Interval:    QRS Duration: 186 QT Interval:  507 QTC Calculation: 463 R Axis:   36 Text  Interpretation: Sinus rhythm Left bundle branch block LBBB present on previous tracing. no interval change Confirmed by Arby Barrette [phone]) on 08/26/2020 9:14:13 AM   Radiology No results found.  Procedures Procedures (including critical care time)  Medications Ordered in ED Medications  LORazepam (ATIVAN) tablet 0.5 mg (0.5 mg Oral Given 08/26/20 0905)    ED Course  I have reviewed the triage vital signs and the nursing notes.  Pertinent labs & imaging results that were available during my care of the Isabella Sanders were reviewed by me and considered in my medical decision making (see chart for details).    MDM Rules/Calculators/A&P                           NUVIA UHR is a 39 y.o. Sanders with pertinent past medical history of acute systolic heart failure with a EF of 25 to 30%, hypertension, tobacco and alcohol abuse, iron deficiency anemia that presents emergency department today for diarrhea, nausea, abdominal pain that started this morning.  Differential to include gastritis, gastroenteritis, colitis, gastroparesis, other abdominal pathology. PE with no abdominal tenderness, pt appears well.    Work-up today with hemoglobin of 8.8, this did appear to decrease from 10.9 4 days ago, however was 7.7 on 10/9.  Does appear to range from 7.7-10.9.  Will check a fecal occult at this time, Isabella Sanders states that Isabella Sanders is not on her period.  Discharge note states that IDA does not appear to be acute secondary to lack of symptomology, at that time reticulocyte count was normal and they thought that this was consistent with IDA.  Isabella Sanders does also have history of sickle disease trait, vitamin B12 and folate were normal.  There were microcytosis on the blood smear.  Fecal occult negative.  CMP without any acute abnormalities, lipase normal.  White count normal.  Isabella Sanders without any abdominal tenderness,  is sitting in bed and drinking soda.  Isabella Sanders states that Isabella Sanders feels much better than when Isabella Sanders  came in without intervention, however still feels slightly nauseous.  Will give some Ativan at this time and reevaluate, Isabella Sanders does have prolonged QTC at 545 during last visit. Will refrain from Zofran at this time.  Upon reassessment, Isabella Sanders states that Isabella Sanders feels much better with nausea medication, Isabella Sanders ready to be discharged.  States that symptoms are resolved.  Repeat abdominal pain exam without any tenderness.  Isabella Sanders has drinking 2 cups of water while here, tolerating p.o. great. Isabella Sanders has now been observed for 4 hours, no diarrhea or vomiting here.  Passed p.o. challenge.  Isabella Sanders to be discharged at this time.  Isabella Sanders will follow up with cardiology in regards to previous discharge and also has upcoming appointment with GI.  I think Isabella Sanders most likely had acute gastroenteritis from lettuce from sandwich, without any abdominal tenderness I do not think that we need imaging at this time.  Isabella Sanders agrees with this, did discussed when to come back to the ER, Isabella Sanders and sister appear reliable.  Doubt need for further emergent work up at this time. I explained the diagnosis and have given explicit precautions to return to the ER including for any other new or worsening symptoms. The Isabella Sanders understands and accepts the medical plan as it's been dictated and I have answered their questions. Discharge instructions concerning home care and prescriptions have been given. The Isabella Sanders is STABLE and is discharged to home in good condition.   Final Clinical Impression(s) / ED Diagnoses Final diagnoses:  Gastroenteritis    Rx / DC Orders ED Discharge Orders    None       Farrel Gordon, PA-C 08/26/20 1109    Arby Barrette, MD 08/27/20 1240

## 2020-08-26 NOTE — ED Triage Notes (Signed)
Patient arrived with EMS from home reports pain across her abdomen with emesis and diarrhea this evening , denies fever or chills .

## 2020-08-26 NOTE — ED Notes (Signed)
Got patient in a gown on the monitor got patient a warm blanket patient is resting with call bell in reach

## 2020-08-26 NOTE — ED Notes (Signed)
MD at bedside. 

## 2020-09-01 ENCOUNTER — Ambulatory Visit (INDEPENDENT_AMBULATORY_CARE_PROVIDER_SITE_OTHER): Payer: Self-pay | Admitting: Physician Assistant

## 2020-09-01 ENCOUNTER — Encounter: Payer: Self-pay | Admitting: Physician Assistant

## 2020-09-01 ENCOUNTER — Other Ambulatory Visit: Payer: Self-pay

## 2020-09-01 VITALS — BP 118/62 | HR 57 | Ht 67.5 in | Wt 148.9 lb

## 2020-09-01 DIAGNOSIS — I428 Other cardiomyopathies: Secondary | ICD-10-CM

## 2020-09-01 DIAGNOSIS — D509 Iron deficiency anemia, unspecified: Secondary | ICD-10-CM

## 2020-09-01 DIAGNOSIS — Z79899 Other long term (current) drug therapy: Secondary | ICD-10-CM

## 2020-09-01 DIAGNOSIS — I5021 Acute systolic (congestive) heart failure: Secondary | ICD-10-CM

## 2020-09-01 NOTE — Patient Instructions (Signed)
Medication Instructions:  Continue current medications  *If you need a refill on your cardiac medications before your next appointment, please call your pharmacy*   Lab Work: CBC, BMP and Digoxin Level  If you have labs (blood work) drawn today and your tests are completely normal, you will receive your results only by: Marland Kitchen MyChart Message (if you have MyChart) OR . A paper copy in the mail If you have any lab test that is abnormal or we need to change your treatment, we will call you to review the results.   Testing/Procedures: Your physician has requested that you have an echocardiogram in 3 Months. Echocardiography is a painless test that uses sound waves to create images of your heart. It provides your doctor with information about the size and shape of your heart and how well your heart's chambers and valves are working. This procedure takes approximately one hour. There are no restrictions for this procedure.   Follow-Up: At [facility], you and your health needs are our priority.  As part of our continuing mission to provide you with exceptional heart care, we have created designated Provider Care Teams.  These Care Teams include your primary Cardiologist (physician) and Advanced Practice Providers (APPs -  Physician Assistants and Nurse Practitioners) who all work together to provide you with the care you need, when you need it.  We recommend signing up for the patient portal called "MyChart".  Sign up information is provided on this After Visit Summary.  MyChart is used to connect with patients for Virtual Visits (Telemedicine).  Patients are able to view lab/test results, encounter notes, upcoming appointments, etc.  Non-urgent messages can be sent to your provider as well.   To learn more about what you can do with MyChart, go to [email or web address].    Your next appointment:   3 month(s)  The format for your next appointment:   In Person  Provider:   You may see  Reatha Harps, MD or one of the following Advanced Practice Providers on your designated Care Team:    Azalee Course, PA-C  Micah Flesher, PA-C or   Judy Pimple, [place]

## 2020-09-01 NOTE — Progress Notes (Signed)
Cardiology Office Note:    Date:  09/03/2020   ID:  Isabella Sanders, DOB August 17, 1981, MRN [id number]  PCP:  Pcp, No  [facility] Cardiologist:  Reatha Harps, MD  [facility] Electrophysiologist:  None   Referring MD: No ref. provider found   Chief Complaint  Patient presents with  . Follow-up    seen for Dr. Flora Lipps    History of Present Illness:    Isabella Sanders is a 39 y.o. female with a hx of tobacco abuse and alcohol abuse who was recently admitted for new onset of CHF in November 2021.  She initially presented to the ED on 08/19/2020 was complained of lower extremity edema.  She was also hypertensive with systolic blood pressure in the 170s.  EKG shows sinus rhythm with left bundle branch block.  BNP elevated at 2726.  Chest x-ray consistent with acute CHF.  Both protein and albumin were low.  Creatinine was normal.  Echocardiogram showed EF of 25 to 30% with global hypokinesis, grade 3 DD, moderate to severe MR, moderately elevated PASP, small pericardial effusion with no sign of tamponade.  Her LV dysfunction was felt to be related to hypertension and significant LV dysfunction in the setting of LBBB.  She underwent IV diuresis.  Patient was placed on both carvedilol and Entresto.  As for her iron deficiency anemia, GI was consulted and the plan to further work-up as outpatient.  Cardiac catheterization performed on 08/22/2020 showed EF less than 25%, angiographically normal coronary arteries, wedge pressure 16 mmHg, cardiac output 6.5 L/min, cardiac index 3.6.  She was diagnosed with nonischemic cardiomyopathy.  Since discharge, patient returned to the hospital on 08/26/2020 due to abdominal discomfort attributed to gastroenteritis.  Hemoglobin did dip down to 8.8.  Pregnancy test negative.  Patient presents today for follow-up.  Her weight is stable since discharge.  She has not had any further episode of shortness of breath, orthopnea or PND.  She denies any recent chest  pain.  Blood pressure is stable on current therapy.  I recommended CBC, BMP and digoxin level.  I will continue her on the current therapy.  Sometimes her systolic blood pressure does dip down to the low 100 range, therefore I did not try to increase the Entresto dosage.  She will need a repeat echocardiogram in 3 months prior to her next follow-up.  Past Medical History:  Diagnosis Date  . Tobacco abuse     Past Surgical History:  Procedure Laterality Date  . CESAREAN SECTION    . CHOLECYSTECTOMY    . RIGHT/LEFT HEART CATH AND CORONARY ANGIOGRAPHY N/A 08/22/2020   Procedure: RIGHT/LEFT HEART CATH AND CORONARY ANGIOGRAPHY;  Surgeon: Corky Crafts, MD;  Location: [facility] INVASIVE CV LAB;  Service: Cardiovascular;  Laterality: N/A;    Current Medications: Current Meds  Medication Sig  . carvedilol (COREG) 25 MG tablet Take 1 tablet (25 mg total) by mouth 2 (two) times daily with a meal.  . digoxin (LANOXIN) 0.125 MG tablet Take 1 tablet (0.125 mg total) by mouth daily.  . feeding supplement (ENSURE ENLIVE / ENSURE PLUS) LIQD Take 237 mLs by mouth daily.  . furosemide (LASIX) 40 MG tablet Take 1 tablet (40 mg total) by mouth 2 (two) times daily.  Marland Kitchen guaiFENesin (MUCINEX) 600 MG 12 hr tablet Take 600 mg by mouth 2 (two) times daily as needed.  . potassium chloride SA (KLOR-CON) 20 MEQ tablet Take 1 tablet (20 mEq total) by mouth 2 (two) times  daily. Take with your Lasix.  Marland Kitchen sacubitril-valsartan (ENTRESTO) 49-51 MG Take 1 tablet by mouth 2 (two) times daily.  Marland Kitchen spironolactone (ALDACTONE) 25 MG tablet Take 0.5 tablets (12.5 mg total) by mouth daily.     Allergies:   Patient has no known allergies.   Social History   Socioeconomic History  . Marital status: Single    Spouse name: Not on file  . Number of children: Not on file  . Years of education: Not on file  . Highest education level: Not on file  Occupational History  . Not on file  Tobacco Use  . Smoking status: Current Every  Day Smoker  . Smokeless tobacco: Never Used  Substance and Sexual Activity  . Alcohol use: Yes    Comment: everyday.  . Drug use: Not Currently  . Sexual activity: Not on file  Other Topics Concern  . Not on file  Social History Narrative  . Not on file   Social Determinants of Health   Financial Resource Strain:   . Difficulty of Paying Living Expenses: Not on file  Food Insecurity:   . Worried About Programme researcher, broadcasting/film/video in the Last Year: Not on file  . Ran Out of Food in the Last Year: Not on file  Transportation Needs:   . Lack of Transportation (Medical): Not on file  . Lack of Transportation (Non-Medical): Not on file  Physical Activity:   . Days of Exercise per Week: Not on file  . Minutes of Exercise per Session: Not on file  Stress:   . Feeling of Stress : Not on file  Social Connections:   . Frequency of Communication with Friends and Family: Not on file  . Frequency of Social Gatherings with Friends and Family: Not on file  . Attends Religious Services: Not on file  . Active Member of Clubs or Organizations: Not on file  . Attends Banker Meetings: Not on file  . Marital Status: Not on file     Family History: The patient's family history includes Heart attack in her sister; Sudden Cardiac Death (age of onset: 51) in her sister.  ROS:   Please see the history of present illness.     All other systems reviewed and are negative.  EKGs/Labs/Other Studies Reviewed:    The following studies were reviewed today:  Echo 08/20/2020 1. Left ventricular ejection fraction, by estimation, is 25 to 30%. The  left ventricle has severely decreased function. The left ventricle  demonstrates global hypokinesis. The left ventricular internal cavity size  was moderately dilated. Left  ventricular diastolic parameters are consistent with Grade III diastolic  dysfunction (restrictive). Elevated left atrial pressure.  2. Right ventricular systolic function is  normal. The right ventricular  size is normal. There is moderately elevated pulmonary artery systolic  pressure. The estimated right ventricular systolic pressure is 54.9 mmHg.  3. Left atrial size was severely dilated.  4. Right atrial size was moderately dilated.  5. A small pericardial effusion is present. The pericardial effusion is  circumferential. There is no evidence of cardiac tamponade.  6. The mitral valve is normal in structure. Moderate to severe mitral  valve regurgitation. No evidence of mitral stenosis.  7. The aortic valve is normal in structure. Aortic valve regurgitation is  not visualized. No aortic stenosis is present.  8. The inferior vena cava is dilated in size with <50% respiratory  variability, suggesting right atrial pressure of 15 mmHg.    EKG:  EKG is not ordered today.    Recent Labs: 08/20/2020: B Natriuretic Peptide 2,726.1; TSH 2.161 08/21/2020: Magnesium 1.9 08/26/2020: ALT 20 09/01/2020: BUN 13; Creatinine, Ser 0.81; Hemoglobin 9.3; Platelets 410; Potassium 4.0; Sodium 139  Recent Lipid Panel    Component Value Date/Time   CHOL 122 08/21/2020 0209   TRIG 40 08/21/2020 0209   HDL 32 (L) 08/21/2020 0209   CHOLHDL 3.8 08/21/2020 0209   VLDL 8 08/21/2020 0209   LDLCALC 82 08/21/2020 0209     Risk Assessment/Calculations:       Physical Exam:    VS:  BP 118/62   Pulse (!) 57   Ht 5' 7.5" (1.715 m)   Wt 148 lb 14.1 oz (67.5 kg)   SpO2 98%   BMI 22.97 kg/m     Wt Readings from Last 3 Encounters:  09/01/20 148 lb 14.1 oz (67.5 kg)  08/26/20 163 lb 2.3 oz (74 kg)  08/23/20 149 lb (67.6 kg)     GEN:  Well nourished, well developed in no acute distress HEENT: Normal NECK: No JVD; No carotid bruits LYMPHATICS: No lymphadenopathy CARDIAC: RRR, no murmurs, rubs, gallops RESPIRATORY:  Clear to auscultation without rales, wheezing or rhonchi  ABDOMEN: Soft, non-tender, non-distended MUSCULOSKELETAL:  No edema; No deformity  SKIN:  Warm and dry NEUROLOGIC:  Alert and oriented x 3 PSYCHIATRIC:  Normal affect   ASSESSMENT:    1. Acute systolic heart failure (HCC)   2. Medication management   3. Microcytic hypochromic anemia   4. NICM (nonischemic cardiomyopathy) (HCC)    PLAN:    In order of problems listed above:  1. Acute systolic heart failure: Recently diagnosed.  Patient underwent diuresis and was discharged on 40 mg twice daily dosing of Lasix along with potassium supplement.  I recommend a basic metabolic panel to make sure her renal function is stable.  She appears to be euvolemic on exam  2. Anemia: Scheduled to see GI service as outpatient for further evaluation  3. Nonischemic cardiomyopathy: Recent cardiac catheterization reveals no significant CAD to explain her cardiomyopathy.  She was discharged on carvedilol, digoxin, spironolactone and Entresto.  I did not try to increase the Entresto dosage as sometimes her systolic blood pressure dipped into the low 100 range.  She will need a repeat echocardiogram in 3 months prior to her next follow-up.    Shared Decision Making/Informed Consent        Medication Adjustments/Labs and Tests Ordered: Current medicines are reviewed at length with the patient today.  Concerns regarding medicines are outlined above.  Orders Placed This Encounter  Procedures  . CBC  . Basic Metabolic Panel (BMET)  . Digoxin level  . ECHOCARDIOGRAM COMPLETE   No orders of the defined types were placed in this encounter.   Patient Instructions  Medication Instructions:  Continue current medications  *If you need a refill on your cardiac medications before your next appointment, please call your pharmacy*   Lab Work: CBC, BMP and Digoxin Level  If you have labs (blood work) drawn today and your tests are completely normal, you will receive your results only by: Marland Kitchen MyChart Message (if you have MyChart) OR . A paper copy in the mail If you have any lab test that is  abnormal or we need to change your treatment, we will call you to review the results.   Testing/Procedures: Your physician has requested that you have an echocardiogram in 3 Months. Echocardiography is a painless test that uses sound  waves to create images of your heart. It provides your doctor with information about the size and shape of your heart and how well your heart's chambers and valves are working. This procedure takes approximately one hour. There are no restrictions for this procedure.   Follow-Up: At [facility], you and your health needs are our priority.  As part of our continuing mission to provide you with exceptional heart care, we have created designated Provider Care Teams.  These Care Teams include your primary Cardiologist (physician) and Advanced Practice Providers (APPs -  Physician Assistants and Nurse Practitioners) who all work together to provide you with the care you need, when you need it.  We recommend signing up for the patient portal called "MyChart".  Sign up information is provided on this After Visit Summary.  MyChart is used to connect with patients for Virtual Visits (Telemedicine).  Patients are able to view lab/test results, encounter notes, upcoming appointments, etc.  Non-urgent messages can be sent to your provider as well.   To learn more about what you can do with MyChart, go to [email or web address].    Your next appointment:   3 month(s)  The format for your next appointment:   In Person  Provider:   You may see Reatha Harps, MD or one of the following Advanced Practice Providers on your designated Care Team:    Azalee Course, PA-C  Micah Flesher, PA-C or   Judy Pimple, PA-C        Signed, [place], [place]  09/03/2020 11:51 PM    [facility]

## 2020-09-02 LAB — BASIC METABOLIC PANEL
BUN/Creatinine Ratio: 16 (ref 9–23)
BUN: 13 mg/dL (ref 6–24)
CO2: 32 mmol/L — ABNORMAL HIGH (ref 20–29)
Calcium: 9.3 mg/dL (ref 8.7–10.2)
Chloride: 96 mmol/L (ref 96–106)
Creatinine, Ser: 0.81 mg/dL (ref 0.57–1.00)
GFR calc Af Amer: 105 mL/min/{1.73_m2} (ref >59)
GFR calc non Af Amer: 91 mL/min/{1.73_m2} (ref >59)
Glucose: 63 mg/dL — ABNORMAL LOW (ref 65–99)
Potassium: 4 mmol/L (ref 3.5–5.2)
Sodium: 139 mmol/L (ref 134–144)

## 2020-09-02 LAB — CBC
Hematocrit: 32.2 % — ABNORMAL LOW (ref 34.0–46.6)
Hemoglobin: 9.3 g/dL — ABNORMAL LOW (ref 11.1–15.9)
MCH: 20.4 pg — ABNORMAL LOW (ref 26.6–33.0)
MCHC: 28.9 g/dL — ABNORMAL LOW (ref 31.5–35.7)
MCV: 71 fL — ABNORMAL LOW (ref 79–97)
Platelets: 410 10*3/uL (ref 150–450)
RBC: 4.55 x10E6/uL (ref 3.77–5.28)
RDW: 18.8 % — ABNORMAL HIGH (ref 11.7–15.4)
WBC: 4.4 10*3/uL (ref 3.4–10.8)

## 2020-09-02 LAB — DIGOXIN LEVEL: Digoxin, Serum: 0.5 ng/mL (ref 0.5–0.9)

## 2020-09-03 ENCOUNTER — Encounter: Payer: Self-pay | Admitting: Physician Assistant

## 2020-09-18 ENCOUNTER — Ambulatory Visit: Payer: Self-pay | Attending: Internal Medicine | Admitting: Internal Medicine

## 2020-09-18 ENCOUNTER — Encounter: Payer: Self-pay | Admitting: Internal Medicine

## 2020-09-18 ENCOUNTER — Other Ambulatory Visit: Payer: Self-pay | Admitting: Internal Medicine

## 2020-09-18 ENCOUNTER — Other Ambulatory Visit: Payer: Self-pay

## 2020-09-18 VITALS — BP 114/69 | HR 75 | Temp 98.0°F | Resp 16 | Ht 67.0 in | Wt 163.2 lb

## 2020-09-18 DIAGNOSIS — Z87891 Personal history of nicotine dependence: Secondary | ICD-10-CM

## 2020-09-18 DIAGNOSIS — D500 Iron deficiency anemia secondary to blood loss (chronic): Secondary | ICD-10-CM

## 2020-09-18 DIAGNOSIS — Z7689 Persons encountering health services in other specified circumstances: Secondary | ICD-10-CM

## 2020-09-18 DIAGNOSIS — I10 Essential (primary) hypertension: Secondary | ICD-10-CM

## 2020-09-18 DIAGNOSIS — Z2821 Immunization not carried out because of patient refusal: Secondary | ICD-10-CM

## 2020-09-18 DIAGNOSIS — F1011 Alcohol abuse, in remission: Secondary | ICD-10-CM

## 2020-09-18 DIAGNOSIS — I5040 Unspecified combined systolic (congestive) and diastolic (congestive) heart failure: Secondary | ICD-10-CM

## 2020-09-18 MED ORDER — DIGOXIN 125 MCG PO TABS: 0.1250 mg | ORAL_TABLET | Freq: Every day | ORAL | 6 refills | Status: DC

## 2020-09-18 MED ORDER — FERROUS SULFATE 325 (65 FE) MG PO TABS: 325.0000 mg | ORAL_TABLET | Freq: Every day | ORAL | 0 refills | Status: DC

## 2020-09-18 MED ORDER — CARVEDILOL 25 MG PO TABS: 25.0000 mg | ORAL_TABLET | Freq: Two times a day (BID) | ORAL | 6 refills | Status: DC

## 2020-09-18 MED ORDER — SPIRONOLACTONE 25 MG PO TABS: 12.5000 mg | ORAL_TABLET | Freq: Every day | ORAL | 6 refills | Status: DC

## 2020-09-18 MED ORDER — SACUBITRIL-VALSARTAN 49-51 MG PO TABS: 1.0000 | ORAL_TABLET | Freq: Two times a day (BID) | ORAL | 6 refills | Status: DC

## 2020-09-18 MED ORDER — POTASSIUM CHLORIDE CRYS ER 20 MEQ PO TBCR: 20.0000 meq | EXTENDED_RELEASE_TABLET | Freq: Two times a day (BID) | ORAL | 6 refills | Status: DC

## 2020-09-18 MED ORDER — FUROSEMIDE 40 MG PO TABS: 40.0000 mg | ORAL_TABLET | Freq: Two times a day (BID) | ORAL | 6 refills | Status: DC

## 2020-09-18 MED FILL — DIGOXIN 0.125 MG TABLET: 125 | 30 days supply | Qty: 30 | Fill #0

## 2020-09-18 MED FILL — CARVEDILOL 25 MG TABLET: 25 | 30 days supply | Qty: 60 | Fill #0

## 2020-09-18 MED FILL — FERROUS SULFATE 325 MG TAB: 325 (65 FE) | 30 days supply | Qty: 30 | Fill #0

## 2020-09-18 MED FILL — SPIRONOLACTONE 25 MG TABLET: 25 | 30 days supply | Qty: 15 | Fill #0

## 2020-09-18 MED FILL — POTASSIUM CHLORIDE 20meqER: 20 | 30 days supply | Qty: 60 | Fill #0

## 2020-09-18 MED FILL — FUROSEMIDE 40 MG TAB: 40 | 30 days supply | Qty: 60 | Fill #0

## 2020-09-18 NOTE — Patient Instructions (Signed)
I have sent your prescriptions to our pharmacy. We have added iron supplement called ferrous sulfate.  Take 1 tablet daily. Please complete the forms for the orange card/cone discount.  Once completed and you have all of the other supporting documents, please call the front desk to request an appointment with our financial counselor.

## 2020-09-18 NOTE — Progress Notes (Signed)
Patient ID: Isabella Sanders, female    DOB: Jul 01, 1981  MRN: [id number]  CC: Hospitalization Follow-up   Subjective: Isabella Sanders is a 39 y.o. female who presents for hospital follow-up and to establish care. Her concerns today include:  Patient with history of HTN, systolic CHF, EtOH abuse, tobacco dependence, iron deficiency anemia.  Patient hospitalized 11/9-13/2021 with acute systolic heart failure presenting with shortness of breath and lower extremity edema.  Echo showed EF of 25-30% and grade 3 diastolic dysfunction.  Cardiac catheterization revealed findings consistent with nonischemic cardiomyopathy.  Patient started on Entresto, carvedilol, Lasix, spironolactone and digoxin.  Advised to quit smoking.  She was noted to have microcytic anemia.  Patient gave history of menorrhagia in the past.  She was asymptomatic.  Ferritin level was 7.  Today: CHF: She has seen cardiology PA in follow-up visit the end of last month. Compliant with taking her medications but will need assistance on getting refills. Denies shortness of breath, PND orthopnea or lower extremity edema. Limiting salt in the foods. Has a scale and has been weighing herself.  Weight fluctuates 1 to 3 pounds about every 2 to 3 days. -Plan is follow-up with cardiology again in a few months with repeat echo in 3 months.  Tobacco dependence: She quit and has not smoked since hospital admission.  Prior to that she was smoking half a pack a day for 10 years.  She feels she is doing okay at this time.  EtOH use disorder: She used to drink 2 to 3 cans of 16 ounce beers a day.  She quit 2 weeks ago.  She denies any significant cravings at this time.  Anemia: Reports history of heavy menses with clots that usually last 6 days.  However for the past 3 months menses have been light and lasting only 2 to 3 days.  Denies any blood in the stools or black tarry stools.  Denies any chronic abdominal pain.  Denies dysphagia.  Past  medical, social, family history and surgical history reviewed.  Patient Active Problem List   Diagnosis Date Noted  . Influenza vaccine refused 09/18/2020  . Acute systolic heart failure (HCC)   . Acute CHF (congestive heart failure) (HCC) 08/20/2020  . Microcytic hypochromic anemia 08/20/2020  . Alcohol abuse 08/20/2020  . Tobacco abuse 08/20/2020  . Hypertension 08/20/2020     Current Outpatient Medications on File Prior to Visit  Medication Sig Dispense Refill  . feeding supplement (ENSURE ENLIVE / ENSURE PLUS) LIQD Take 237 mLs by mouth daily.     No current facility-administered medications on file prior to visit.    No Known Allergies  Social History   Socioeconomic History  . Marital status: Single    Spouse name: Not on file  . Number of children: 2  . Years of education: Not on file  . Highest education level: 12th grade  Occupational History  . Occupation: Armed forces operational officer  Tobacco Use  . Smoking status: Former Smoker    Years: 10.00  . Smokeless tobacco: Never Used  Vaping Use  . Vaping Use: Never used  Substance and Sexual Activity  . Alcohol use: Yes    Comment: everyday.  . Drug use: Not Currently  . Sexual activity: Not on file  Other Topics Concern  . Not on file  Social History Narrative  . Not on file   Social Determinants of Health   Financial Resource Strain: Not on file  Food Insecurity: Not on file  Transportation Needs: Not on file  Physical Activity: Not on file  Stress: Not on file  Social Connections: Not on file  Intimate Partner Violence: Not on file    Family History  Problem Relation Age of Onset  . Sudden Cardiac Death Sister 51  . Heart attack Sister        "mild" heart attack in her 62's    Past Surgical History:  Procedure Laterality Date  . CESAREAN SECTION    . CHOLECYSTECTOMY    . RIGHT/LEFT HEART CATH AND CORONARY ANGIOGRAPHY N/A 08/22/2020   Procedure: RIGHT/LEFT HEART CATH AND CORONARY ANGIOGRAPHY;   Surgeon: Corky Crafts, MD;  Location: [facility] INVASIVE CV LAB;  Service: Cardiovascular;  Laterality: N/A;    ROS: Review of Systems Negative except as stated above  PHYSICAL EXAM: BP 114/69   Pulse 75   Temp 98 F (36.7 C)   Resp 16   Ht 5\' 7"  (1.702 m)   Wt 163 lb 3.2 oz (74 kg)   SpO2 97%   BMI 25.56 kg/m   Wt Readings from Last 3 Encounters:  09/18/20 163 lb 3.2 oz (74 kg)  09/01/20 148 lb 14.1 oz (67.5 kg)  08/26/20 163 lb 2.3 oz (74 kg)    Physical Exam  General appearance - alert, well appearing, and in no distress Mental status - normal mood, behavior, speech, dress, motor activity, and thought processes Nose - normal and patent, no erythema, discharge or polyps Mouth - mucous membranes moist, pharynx normal without lesions Neck - supple, no significant adenopathy Lymphatics - no palpable lymphadenopathy, no hepatosplenomegaly Chest - clear to auscultation, no wheezes, rales or rhonchi, symmetric air entry Heart -regular rate and rhythm, no gallops.  Soft systolic ejection murmur heard at PMI Extremities -trace lower extremity edema   CMP Latest Ref Rng & Units 09/01/2020 08/26/2020 08/23/2020  Glucose 65 - 99 mg/dL 08/25/2020) 77(O) 98  BUN 6 - 24 mg/dL 13 10 12   Creatinine 0.57 - 1.00 mg/dL 242(P 5.36  Sodium 134 - 144 mmol/L 139 138 137  Potassium 3.5 - 5.2 mmol/L 4.0 4.4 3.9  Chloride 96 - 106 mmol/L 96 102 101  CO2 20 - 29 mmol/L 32(H) 29 28  Calcium 8.7 - 10.2 mg/dL 9.3 8.9 1.44)  Total Protein 6.5 - 8.1 g/dL - 6.5 -  Total Bilirubin 0.3 - 1.2 mg/dL - 0.4 -  Alkaline Phos 38 - 126 U/L - 71 -  AST 15 - 41 U/L - 21 -  ALT 0 - 44 U/L - 20 -   Lipid Panel     Component Value Date/Time   CHOL 122 08/21/2020 0209   TRIG 40 08/21/2020 0209   HDL 32 (L) 08/21/2020 0209   CHOLHDL 3.8 08/21/2020 0209   VLDL 8 08/21/2020 0209   LDLCALC 82 08/21/2020 0209    CBC    Component Value Date/Time   WBC 4.4 09/01/2020 1549   WBC 5.1 08/26/2020 0644    RBC 4.55 09/01/2020 1549   RBC 4.32 08/26/2020 0644   HGB 9.3 (L) 09/01/2020 1549   HCT 32.2 (L) 09/01/2020 1549   PLT 410 09/01/2020 1549   MCV 71 (L) 09/01/2020 1549   MCH 20.4 (L) 09/01/2020 1549   MCH 20.4 (L) 08/26/2020 0644   MCHC 28.9 (L) 09/01/2020 1549   MCHC 28.0 (L) 08/26/2020 0644   RDW 18.8 (H) 09/01/2020 1549   LYMPHSABS 1.0 08/19/2020 2327   MONOABS 0.4 08/19/2020 2327   EOSABS 0.0 08/19/2020  2327   BASOSABS 0.0 08/19/2020 2327    ASSESSMENT AND PLAN:  1. Encounter to establish care   2. Combined systolic and diastolic congestive heart failure, unspecified HF chronicity (HCC) Stable on current current regimen. Encouraged her to continue to weigh herself at least once or twice a week and to let me or her cardiologist know if weight increases by 5 pounds or more.  Continue to limit salt - spironolactone (ALDACTONE) 25 MG tablet; Take 0.5 tablets (12.5 mg total) by mouth daily.  Dispense: 15 tablet; Refill: 6 - potassium chloride SA (KLOR-CON) 20 MEQ tablet; Take 1 tablet (20 mEq total) by mouth 2 (two) times daily. Take with your Lasix.  Dispense: 60 tablet; Refill: 6 - carvedilol (COREG) 25 MG tablet; Take 1 tablet (25 mg total) by mouth 2 (two) times daily with a meal.  Dispense: 60 tablet; Refill: 6 - furosemide (LASIX) 40 MG tablet; Take 1 tablet (40 mg total) by mouth 2 (two) times daily.  Dispense: 60 tablet; Refill: 6 - sacubitril-valsartan (ENTRESTO) 49-51 MG; Take 1 tablet by mouth 2 (two) times daily.  Dispense: 60 tablet; Refill: 6 - digoxin (LANOXIN) 0.125 MG tablet; Take 1 tablet (0.125 mg total) by mouth daily.  Dispense: 30 tablet; Refill: 6  3. Influenza vaccine refused Recommended.  Declined by patient.  4. Essential hypertension Controlled  5. Alcohol use disorder, mild, in early remission, abuse Commended her on quitting.  Encouraged her to remain free of alcohol as it can cause or contribute to cardiomyopathy.  6. Former smoker Commended on  quitting.  Encouraged her to remain tobacco free.  Discussed health risks associated with smoking.  Less than 5 minutes spent on counseling.  7. Iron deficiency anemia due to chronic blood loss Likely due to history of heavy menses. We will get her in in several weeks to do her Pap smear.  If uterus feels enlarged we will get pelvic ultrasound to check for fibroids.  I have started her on iron. - ferrous sulfate 325 (65 FE) MG tablet; Take 1 tablet (325 mg total) by mouth daily with breakfast.  Dispense: 100 tablet; Refill: 0    Patient was given the opportunity to ask questions.  Patient verbalized understanding of the plan and was able to repeat key elements of the plan.   No orders of the defined types were placed in this encounter.    Requested Prescriptions   Signed Prescriptions Disp Refills  . spironolactone (ALDACTONE) 25 MG tablet 15 tablet 6    Sig: Take 0.5 tablets (12.5 mg total) by mouth daily.  . potassium chloride SA (KLOR-CON) 20 MEQ tablet 60 tablet 6    Sig: Take 1 tablet (20 mEq total) by mouth 2 (two) times daily. Take with your Lasix.  . carvedilol (COREG) 25 MG tablet 60 tablet 6    Sig: Take 1 tablet (25 mg total) by mouth 2 (two) times daily with a meal.  . furosemide (LASIX) 40 MG tablet 60 tablet 6    Sig: Take 1 tablet (40 mg total) by mouth 2 (two) times daily.  . sacubitril-valsartan (ENTRESTO) 49-51 MG 60 tablet 6    Sig: Take 1 tablet by mouth 2 (two) times daily.  . digoxin (LANOXIN) 0.125 MG tablet 30 tablet 6    Sig: Take 1 tablet (0.125 mg total) by mouth daily.  . ferrous sulfate 325 (65 FE) MG tablet 100 tablet 0    Sig: Take 1 tablet (325 mg total) by mouth daily with  breakfast.    Return in about 7 weeks (around 11/06/2020) for PAP.  Jonah Blue, MD, FACP

## 2020-10-24 MED FILL — DIGOXIN 0.125 MG TABLET: 125 | 30 days supply | Qty: 30 | Fill #1

## 2020-10-24 MED FILL — CARVEDILOL 25 MG TABLET: 25 | 30 days supply | Qty: 60 | Fill #1

## 2020-10-24 MED FILL — SPIRONOLACTONE 25 MG TABLET: 25 | 30 days supply | Qty: 15 | Fill #1

## 2020-10-24 MED FILL — FUROSEMIDE 40 MG TAB: 40 | 30 days supply | Qty: 60 | Fill #1

## 2020-11-10 ENCOUNTER — Ambulatory Visit: Payer: Self-pay | Admitting: Internal Medicine

## 2020-11-25 MED FILL — FUROSEMIDE 40 MG TAB: 40 | 30 days supply | Qty: 60 | Fill #2

## 2020-11-25 MED FILL — DIGOXIN 0.125 MG TABLET: 125 | 30 days supply | Qty: 30 | Fill #2

## 2020-11-25 MED FILL — SPIRONOLACTONE 25 MG TABLET: 25 | 30 days supply | Qty: 15 | Fill #2

## 2020-11-25 MED FILL — CARVEDILOL 25 MG TABLET: 25 | 30 days supply | Qty: 60 | Fill #2

## 2020-12-03 ENCOUNTER — Ambulatory Visit (HOSPITAL_COMMUNITY): Payer: Self-pay | Attending: Cardiology

## 2020-12-03 ENCOUNTER — Other Ambulatory Visit: Payer: Self-pay

## 2020-12-03 DIAGNOSIS — I5021 Acute systolic (congestive) heart failure: Secondary | ICD-10-CM | POA: Insufficient documentation

## 2020-12-03 LAB — ECHOCARDIOGRAM COMPLETE
Area-P 1/2: 3.01 cm2
MV M vel: 5.12 m/s
MV Peak grad: 104.9 mmHg
Radius: 0.5 cm
S' Lateral: 5.5 cm

## 2020-12-03 NOTE — Progress Notes (Signed)
Cardiology Office Note:   Date:  12/04/2020  NAME:  Isabella Sanders    MRN: [id number] DOB:  1980-11-07   PCP:  Marcine Matar, MD  Cardiologist:  Reatha Harps, MD  Electrophysiologist:  None   Referring MD: No ref. provider found   Chief Complaint  Patient presents with  . Follow-up    3 months.    History of Present Illness:   Isabella Sanders is a 40 y.o. female with a hx of CHF, HTN, tobacco abuse who presents for follow-up. Diagnosed with systolic HF in 08/2020. Follow-up today.  She reports she is doing well.  Denies any chest pain or shortness of breath.  No lower extremity edema.  She has gained some weight but she is eating more.  I did review the results of her echocardiogram.  EF is not recovered.  She has significant LV dyssynchrony.  We discussed further titration of medical therapy to see if this helps.  I am doubtful it well.  She likely will end up needing CRT-P.  We will see how she does.  She reports she would like to try for medical therapy before proceeding down that route.  I think this is reasonable.  She is no longer smoking.  Still working in [employer]  Overall doing well.  EF is not recovered.  Her EKG in office demonstrates sinus bradycardia.  Still has a left bundle branch block.  QRS 186 ms.  Problem List 1. Systolic HF -EF 25-30% 08/20/2020 -LHC normal coronaries 08/22/2020 -EF 30-35% 12/03/2020 2. LBBB -QRS 186 ms 3. HTN 4. Tobacco abuse   Past Medical History: Past Medical History:  Diagnosis Date  . CHF (congestive heart failure) (HCC)   . Hypertension   . Tobacco abuse     Past Surgical History: Past Surgical History:  Procedure Laterality Date  . CARDIAC CATHETERIZATION    . CESAREAN SECTION    . CHOLECYSTECTOMY    . RIGHT/LEFT HEART CATH AND CORONARY ANGIOGRAPHY N/A 08/22/2020   Procedure: RIGHT/LEFT HEART CATH AND CORONARY ANGIOGRAPHY;  Surgeon: Corky Crafts, MD;  Location: [facility] INVASIVE CV LAB;  Service:  Cardiovascular;  Laterality: N/A;  . TUBAL LIGATION      Current Medications: Current Meds  Medication Sig  . carvedilol (COREG) 25 MG tablet Take 1 tablet (25 mg total) by mouth 2 (two) times daily with a meal.  . empagliflozin (JARDIANCE) 10 MG TABS tablet Take 1 tablet (10 mg total) by mouth daily before breakfast.  . ferrous sulfate 325 (65 FE) MG tablet Take 1 tablet (325 mg total) by mouth daily with breakfast.  . sacubitril-valsartan (ENTRESTO) 97-103 MG Take 1 tablet by mouth 2 (two) times daily.  Marland Kitchen spironolactone (ALDACTONE) 25 MG tablet Take 0.5 tablets (12.5 mg total) by mouth daily.  . [DISCONTINUED] digoxin (LANOXIN) 0.125 MG tablet Take 1 tablet (0.125 mg total) by mouth daily.  . [DISCONTINUED] feeding supplement (ENSURE ENLIVE / ENSURE PLUS) LIQD Take 237 mLs by mouth daily.  . [DISCONTINUED] furosemide (LASIX) 40 MG tablet Take 1 tablet (40 mg total) by mouth 2 (two) times daily.  . [DISCONTINUED] potassium chloride SA (KLOR-CON) 20 MEQ tablet Take 1 tablet (20 mEq total) by mouth 2 (two) times daily. Take with your Lasix.  . [DISCONTINUED] sacubitril-valsartan (ENTRESTO) 49-51 MG Take 1 tablet by mouth 2 (two) times daily.     Allergies:    Patient has no known allergies.   Social History: Social History   Socioeconomic  History  . Marital status: Single    Spouse name: Not on file  . Number of children: 2  . Years of education: Not on file  . Highest education level: 12th grade  Occupational History  . Occupation: Restaurant  Tobacco Use  . Smoking status: Former Smoker    Years: 10.00  . Smokeless tobacco: Never Used  Vaping Use  . Vaping Use: Never used  Substance and Sexual Activity  . Alcohol use: Never  . Drug use: Not Currently  . Sexual activity: Not on file  Other Topics Concern  . Not on file  Social History Narrative  . Not on file   Social Determinants of Health   Financial Resource Strain: Not on file  Food Insecurity: Not on file   Transportation Needs: Not on file  Physical Activity: Not on file  Stress: Not on file  Social Connections: Not on file     Family History: The patient's family history includes Heart attack in her sister; Sudden Cardiac Death (age of onset: 75) in her sister.  ROS:   All other ROS reviewed and negative. Pertinent positives noted in the HPI.     EKGs/Labs/Other Studies Reviewed:   The following studies were personally reviewed by me today:  EKG:  EKG is ordered today.  The ekg ordered today demonstrates sinus bradycardia heart rate 52, left bundle branch block 186 ms, and was personally reviewed by me.   TTE 12/03/2020 1. Left ventricular ejection fraction, by estimation, is 30 to 35%. The  left ventricle has moderately decreased function. The left ventricle  demonstrates global hypokinesis. The left ventricular internal cavity size  was mildly dilated. There is moderate  concentric left ventricular hypertrophy. Left ventricular diastolic  parameters are consistent with Grade II diastolic dysfunction  (pseudonormalization). Elevated left atrial pressure. The E/e' is 18.4.  2. Right ventricular systolic function is normal. The right ventricular  size is mildly enlarged. There is normal pulmonary artery systolic  pressure.  3. Left atrial size was severely dilated.  4. The mitral valve is normal in structure. Mild to moderate mitral valve  regurgitation.  5. The aortic valve is tricuspid. Aortic valve regurgitation is not  visualized. No aortic stenosis is present.  6. The inferior vena cava is normal in size with greater than 50%  respiratory variability, suggesting right atrial pressure of 3 mmHg.   Recent Labs: 08/20/2020: B Natriuretic Peptide 2,726.1; TSH 2.161 08/21/2020: Magnesium 1.9 08/26/2020: ALT 20 09/01/2020: BUN 13; Creatinine, Ser 0.81; Hemoglobin 9.3; Platelets 410; Potassium 4.0; Sodium 139   Recent Lipid Panel    Component Value Date/Time   CHOL  122 08/21/2020 0209   TRIG 40 08/21/2020 0209   HDL 32 (L) 08/21/2020 0209   CHOLHDL 3.8 08/21/2020 0209   VLDL 8 08/21/2020 0209   LDLCALC 82 08/21/2020 0209    Physical Exam:   VS:  BP 138/68 (BP Location: Left Arm, Patient Position: Sitting, Cuff Size: Normal)   Pulse (!) 52   Ht 5\' 7"  (1.702 m)   Wt 182 lb (82.6 kg)   BMI 28.51 kg/m    Wt Readings from Last 3 Encounters:  12/04/20 182 lb (82.6 kg)  09/18/20 163 lb 3.2 oz (74 kg)  09/01/20 148 lb 14.1 oz (67.5 kg)    General: Well nourished, well developed, in no acute distress Head: Atraumatic, normal size  Eyes: PEERLA, EOMI  Neck: Supple, no JVD Endocrine: No thryomegaly Cardiac: Normal S1, S2; RRR; no murmurs, rubs, or  gallops Lungs: Clear to auscultation bilaterally, no wheezing, rhonchi or rales  Abd: Soft, nontender, no hepatomegaly  Ext: No edema, pulses 2+ Musculoskeletal: No deformities, BUE and BLE strength normal and equal Skin: Warm and dry, no rashes   Neuro: Alert and oriented to person, place, time, and situation, CNII-XII grossly intact, no focal deficits  Psych: Normal mood and affect   ASSESSMENT:   Isabella Sanders is a 40 y.o. female who presents for the following: 1. Chronic systolic heart failure (HCC)   2. LBBB (left bundle branch block)   3. Primary hypertension   4. Tobacco abuse   5. Combined systolic and diastolic congestive heart failure, unspecified HF chronicity (HCC)    PLAN:   1. Chronic systolic heart failure (HCC) 2. LBBB (left bundle branch block) -Diagnosed with systolic heart failure in November 2021.  This was in the setting of uncontrolled hypertension.  Left heart catheterization was normal.  EKG with left bundle branch block with wide QRS 186 ms.  Echocardiogram from yesterday shows LVEF has not recovered.  We discussed proceeding with pacemaker implantation with defibrillator which would include CRT-D versus trying to further optimize her medical regimen.  Right now she would  like to give her heart every shot to get better.  I think this is reasonable.  We will continue Coreg 25 twice a day.  Increase Entresto to 97-103 mg twice daily.  Stop digoxin given stable blood pressure.  Continue Aldactone 12.5 mg daily.  Reduce Lasix to 40 mg daily.  We will add Jardiance 10 mg daily. -Of note she is status post tubal ligation.  Pregnancy is not going to be an issue moving forward. -I will see her back in 3 months.  We will plan to repeat an echocardiogram before then.  If no recovery with optimization of medical therapy she will need to be considered for CRT-D.  3. Primary hypertension -Well-controlled.  4. Tobacco abuse -She quit smoking  Disposition: Return in about 3 months (around 03/03/2021).  Medication Adjustments/Labs and Tests Ordered: Current medicines are reviewed at length with the patient today.  Concerns regarding medicines are outlined above.  Orders Placed This Encounter  Procedures  . EKG 12-Lead  . ECHOCARDIOGRAM COMPLETE   Meds ordered this encounter  Medications  . sacubitril-valsartan (ENTRESTO) 97-103 MG    Sig: Take 1 tablet by mouth 2 (two) times daily.    Dispense:  60 tablet    Refill:  3  . furosemide (LASIX) 40 MG tablet    Sig: Take 1 tablet (40 mg total) by mouth daily.    Dispense:  60 tablet    Refill:  2  . potassium chloride SA (KLOR-CON) 20 MEQ tablet    Sig: Take 2 tablets (40 mEq total) by mouth daily. Take with your Lasix.    Dispense:  180 tablet    Refill:  2  . empagliflozin (JARDIANCE) 10 MG TABS tablet    Sig: Take 1 tablet (10 mg total) by mouth daily before breakfast.    Dispense:  90 tablet    Refill:  1    Patient Instructions  Medication Instructions:  Stop Digoxin When you finish your Entresto 49/51, start Entresto 97/103 dose Reduce Lasix to 40 mg daily  Take Potassium 40 mg daily  Start Jardiance 10 mg daily   *If you need a refill on your cardiac medications before your next appointment, please  call your pharmacy*   Testing/Procedures:  Echocardiogram (right before 3 month follow up) -  Your physician has requested that you have an echocardiogram. Echocardiography is a painless test that uses sound waves to create images of your heart. It provides your doctor with information about the size and shape of your heart and how well your heart's chambers and valves are working. This procedure takes approximately one hour. There are no restrictions for this procedure. This will be performed at our [facility] location - [address], [address].    Follow-Up: At [facility], you and your health needs are our priority.  As part of our continuing mission to provide you with exceptional heart care, we have created designated Provider Care Teams.  These Care Teams include your primary Cardiologist (physician) and Advanced Practice Providers (APPs -  Physician Assistants and Nurse Practitioners) who all work together to provide you with the care you need, when you need it.  We recommend signing up for the patient portal called "MyChart".  Sign up information is provided on this After Visit Summary.  MyChart is used to connect with patients for Virtual Visits (Telemedicine).  Patients are able to view lab/test results, encounter notes, upcoming appointments, etc.  Non-urgent messages can be sent to your provider as well.   To learn more about what you can do with MyChart, go to [email or web address].    Your next appointment:   3 month(s)  The format for your next appointment:   In Person  Provider:   Lennie Odor, MD        Time Spent with Patient: I have spent a total of 35 minutes with patient reviewing hospital notes, telemetry, EKGs, labs and examining the patient as well as establishing an assessment and plan that was discussed with the patient.  > 50% of time was spent in direct patient care.  Signed, Lenna Gilford. Flora Lipps, MD [facility]  [address],  [address] [place], [place] [address] [phone]  12/04/2020 10:50 AM

## 2020-12-04 ENCOUNTER — Encounter: Payer: Self-pay | Admitting: Cardiovascular Disease

## 2020-12-04 ENCOUNTER — Ambulatory Visit (INDEPENDENT_AMBULATORY_CARE_PROVIDER_SITE_OTHER): Payer: Self-pay | Admitting: Cardiovascular Disease

## 2020-12-04 ENCOUNTER — Other Ambulatory Visit: Payer: Self-pay | Admitting: Cardiovascular Disease

## 2020-12-04 ENCOUNTER — Telehealth: Payer: Self-pay

## 2020-12-04 VITALS — BP 138/68 | HR 52 | Ht 67.0 in | Wt 182.0 lb

## 2020-12-04 DIAGNOSIS — Z720 Tobacco use: Secondary | ICD-10-CM

## 2020-12-04 DIAGNOSIS — I10 Essential (primary) hypertension: Secondary | ICD-10-CM

## 2020-12-04 DIAGNOSIS — I5040 Unspecified combined systolic (congestive) and diastolic (congestive) heart failure: Secondary | ICD-10-CM

## 2020-12-04 DIAGNOSIS — I5022 Chronic systolic (congestive) heart failure: Secondary | ICD-10-CM

## 2020-12-04 DIAGNOSIS — I447 Left bundle-branch block, unspecified: Secondary | ICD-10-CM

## 2020-12-04 MED ORDER — POTASSIUM CHLORIDE CRYS ER 20 MEQ PO TBCR: 40.0000 meq | EXTENDED_RELEASE_TABLET | Freq: Every day | ORAL | 2 refills | Status: DC

## 2020-12-04 MED ORDER — FUROSEMIDE 40 MG PO TABS: 40.0000 mg | ORAL_TABLET | Freq: Every day | ORAL | 2 refills | Status: DC

## 2020-12-04 MED ORDER — SACUBITRIL-VALSARTAN 97-103 MG PO TABS: 1.0000 | ORAL_TABLET | Freq: Two times a day (BID) | ORAL | 3 refills | Status: DC

## 2020-12-04 MED ORDER — EMPAGLIFLOZIN 10 MG PO TABS: 10.0000 mg | ORAL_TABLET | Freq: Every day | ORAL | 1 refills | Status: DC

## 2020-12-04 MED FILL — ENTRESTO 97 MG-103 MG TAB: 97-103 | 30 days supply | Qty: 60 | Fill #0

## 2020-12-04 MED FILL — JARDIANCE 10 MG TABLET: 10 | 14 days supply | Qty: 14 | Fill #0

## 2020-12-04 MED FILL — POTASSIUM CL ER 20 MEQ TAB: 20 | 30 days supply | Qty: 60 | Fill #0

## 2020-12-04 NOTE — Patient Instructions (Signed)
Medication Instructions:  Stop Digoxin When you finish your Entresto 49/51, start Entresto 97/103 dose Reduce Lasix to 40 mg daily  Take Potassium 40 mg daily  Start Jardiance 10 mg daily   *If you need a refill on your cardiac medications before your next appointment, please call your pharmacy*   Testing/Procedures:  Echocardiogram (right before 3 month follow up) - Your physician has requested that you have an echocardiogram. Echocardiography is a painless test that uses sound waves to create images of your heart. It provides your doctor with information about the size and shape of your heart and how well your heart's chambers and valves are working. This procedure takes approximately one hour. There are no restrictions for this procedure. This will be performed at our [facility] location - [address].    Follow-Up: At [facility], you and your health needs are our priority.  As part of our continuing mission to provide you with exceptional heart care, we have created designated Provider Care Teams.  These Care Teams include your primary Cardiologist (physician) and Advanced Practice Providers (APPs -  Physician Assistants and Nurse Practitioners) who all work together to provide you with the care you need, when you need it.  We recommend signing up for the patient portal called "MyChart".  Sign up information is provided on this After Visit Summary.  MyChart is used to connect with patients for Virtual Visits (Telemedicine).  Patients are able to view lab/test results, encounter notes, upcoming appointments, etc.  Non-urgent messages can be sent to your provider as well.   To learn more about what you can do with MyChart, go to [email or web address].    Your next appointment:   3 month(s)  The format for your next appointment:   In Person  Provider:   Lennie Odor, MD

## 2020-12-04 NOTE — Telephone Encounter (Signed)
Patient is enrolled in [employer] Patient Assistance, please send Entresto escript to [facility] in [place], [place] for refills.

## 2020-12-11 ENCOUNTER — Telehealth: Payer: Self-pay

## 2020-12-11 NOTE — Telephone Encounter (Signed)
Received patient assistance form for Jardiance in mailbox, completed provider portion- will have Dr.O'Neal sign it next week when he is back in office, and will fax over.  Called patient advised that I had the forms, and would fax over next week.

## 2020-12-15 NOTE — Telephone Encounter (Signed)
Signed patient assistance mailed out today 03/07

## 2020-12-22 NOTE — Telephone Encounter (Signed)
Received notification that patient was approved for patient assistance through [employer]. Approved from December 18 2020- December 18 2021.  Will scan into chart.

## 2021-01-07 MED FILL — CARVEDILOL 25 MG TABLET: 25 | 30 days supply | Qty: 60 | Fill #3

## 2021-01-22 ENCOUNTER — Other Ambulatory Visit: Payer: Self-pay

## 2021-01-22 MED FILL — Spironolactone Tab 25 MG: ORAL | 30 days supply | Qty: 15 | Fill #0 | Status: AC

## 2021-02-04 ENCOUNTER — Other Ambulatory Visit: Payer: Self-pay

## 2021-02-04 MED FILL — Furosemide Tab 40 MG: ORAL | 30 days supply | Qty: 30 | Fill #0 | Status: AC

## 2021-02-23 ENCOUNTER — Other Ambulatory Visit: Payer: Self-pay

## 2021-02-23 ENCOUNTER — Ambulatory Visit (HOSPITAL_COMMUNITY): Payer: Self-pay | Attending: Cardiology

## 2021-02-23 DIAGNOSIS — I5022 Chronic systolic (congestive) heart failure: Secondary | ICD-10-CM | POA: Insufficient documentation

## 2021-02-23 LAB — ECHOCARDIOGRAM COMPLETE
Area-P 1/2: 3.68 cm2
MV M vel: 4.76 m/s
MV Peak grad: 90.6 mmHg
Radius: 0.5 cm
S' Lateral: 4.9 cm

## 2021-02-23 MED FILL — Carvedilol Tab 25 MG: ORAL | 30 days supply | Qty: 60 | Fill #0 | Status: AC

## 2021-02-23 MED FILL — Spironolactone Tab 25 MG: ORAL | 30 days supply | Qty: 15 | Fill #1 | Status: AC

## 2021-03-04 NOTE — Progress Notes (Signed)
Cardiology Office Note:   Date:  03/05/2021  NAME:  DONNIS PECHA    MRN: [id number] DOB:  Dec 23, 1980   PCP:  Marcine Matar, MD  Cardiologist:  Reatha Harps, MD  Electrophysiologist:  None   Referring MD: Marcine Matar, MD   Chief Complaint  Patient presents with  . Congestive Heart Failure   History of Present Illness:   LUCIA HARM is a 40 y.o. female with a hx of systolic HF, LBBB, HTN, tobacco abuse who presents for follow-up. EF has not improved.  She reports she is doing well.  She has been out of Entresto for 3 days.  Apparently she had issues getting the 97-103 mg tablet.  We will look into this.  No increased lower extremity edema.  BP 142/86 today.  She denies any chest pain or shortness of breath.  She has finally quit smoking.  I did discuss with her that her EF has not recovered.  She does have a left bundle branch block with a wide QRS.  I think she would benefit from CRT-D.  We will set her up to see EP for this.  We will also help get her on assistance for [facility].  She does need repeat labs today.  Problem List 1. Systolic HF -EF 25-30% 08/20/2020 -LHC normal coronaries 08/22/2020 -EF 30-35% 12/03/2020 -EF 30-35% 02/23/2021 2. LBBB -QRS 186 ms 3. HTN 4. Tobacco abuse   Past Medical History: Past Medical History:  Diagnosis Date  . CHF (congestive heart failure) (HCC)   . Hypertension   . Tobacco abuse     Past Surgical History: Past Surgical History:  Procedure Laterality Date  . CARDIAC CATHETERIZATION    . CESAREAN SECTION    . CHOLECYSTECTOMY    . RIGHT/LEFT HEART CATH AND CORONARY ANGIOGRAPHY N/A 08/22/2020   Procedure: RIGHT/LEFT HEART CATH AND CORONARY ANGIOGRAPHY;  Surgeon: Corky Crafts, MD;  Location: [facility] INVASIVE CV LAB;  Service: Cardiovascular;  Laterality: N/A;  . TUBAL LIGATION      Current Medications: Current Meds  Medication Sig  . carvedilol (COREG) 25 MG tablet TAKE 1 TABLET (25 MG TOTAL) BY MOUTH 2  (TWO) TIMES DAILY WITH A MEAL.  Marland Kitchen empagliflozin (JARDIANCE) 10 MG TABS tablet TAKE 1 TABLET (10 MG TOTAL) BY MOUTH DAILY BEFORE BREAKFAST.  . ferrous sulfate 325 (65 FE) MG tablet TAKE 1 TABLET (325 MG TOTAL) BY MOUTH DAILY WITH BREAKFAST.  . furosemide (LASIX) 40 MG tablet TAKE 1 TABLET (40 MG TOTAL) BY MOUTH DAILY.  Marland Kitchen potassium chloride SA (KLOR-CON) 20 MEQ tablet TAKE 2 TABLETS (40 MEQ TOTAL) BY MOUTH DAILY. TAKE WITH YOUR LASIX.  Marland Kitchen sacubitril-valsartan (ENTRESTO) 97-103 MG TAKE 1 TABLET BY MOUTH 2 (TWO) TIMES DAILY.  Marland Kitchen spironolactone (ALDACTONE) 25 MG tablet TAKE 0.5 TABLETS (12.5 MG TOTAL) BY MOUTH DAILY.  . [DISCONTINUED] digoxin (LANOXIN) 0.125 MG tablet Take 1 tablet (0.125 mg total) by mouth daily.     Allergies:    Patient has no known allergies.   Social History: Social History   Socioeconomic History  . Marital status: Single    Spouse name: Not on file  . Number of children: 2  . Years of education: Not on file  . Highest education level: 12th grade  Occupational History  . Occupation: Restaurant  Tobacco Use  . Smoking status: Former Smoker    Years: 10.00  . Smokeless tobacco: Never Used  Vaping Use  . Vaping Use: Never used  Substance  and Sexual Activity  . Alcohol use: Never  . Drug use: Not Currently  . Sexual activity: Not on file  Other Topics Concern  . Not on file  Social History Narrative  . Not on file   Social Determinants of Health   Financial Resource Strain: Not on file  Food Insecurity: Not on file  Transportation Needs: Not on file  Physical Activity: Not on file  Stress: Not on file  Social Connections: Not on file     Family History: The patient's family history includes Heart attack in her sister; Sudden Cardiac Death (age of onset: 63) in her sister.  ROS:   All other ROS reviewed and negative. Pertinent positives noted in the HPI.     EKGs/Labs/Other Studies Reviewed:   The following studies were personally reviewed by me  today:  TTE 02/23/2021 1. Left ventricular ejection fraction, by estimation, is 30 to 35%. The  left ventricle has moderately decreased function. The left ventricle  demonstrates global hypokinesis. The left ventricular internal cavity size  was moderately dilated. Left  ventricular diastolic parameters are consistent with Grade I diastolic  dysfunction (impaired relaxation).  2. Right ventricular systolic function is normal. The right ventricular  size is moderately enlarged. There is normal pulmonary artery systolic  pressure.  3. Left atrial size was mildly dilated.  4. The mitral valve is normal in structure. Moderate mitral valve  regurgitation. No evidence of mitral stenosis.  5. The aortic valve is normal in structure. Aortic valve regurgitation is  not visualized. No aortic stenosis is present.  6. The inferior vena cava is normal in size with greater than 50%  respiratory variability, suggesting right atrial pressure of 3 mmHg.   Recent Labs: 08/20/2020: B Natriuretic Peptide 2,726.1; TSH 2.161 08/21/2020: Magnesium 1.9 08/26/2020: ALT 20 09/01/2020: BUN 13; Creatinine, Ser 0.81; Hemoglobin 9.3; Platelets 410; Potassium 4.0; Sodium 139   Recent Lipid Panel    Component Value Date/Time   CHOL 122 08/21/2020 0209   TRIG 40 08/21/2020 0209   HDL 32 (L) 08/21/2020 0209   CHOLHDL 3.8 08/21/2020 0209   VLDL 8 08/21/2020 0209   LDLCALC 82 08/21/2020 0209    Physical Exam:   VS:  BP (!) 142/86   Pulse 64   Ht 5\' 5"  (1.651 m)   Wt 192 lb 12.8 oz (87.5 kg)   SpO2 99%   BMI 32.08 kg/m    Wt Readings from Last 3 Encounters:  03/05/21 192 lb 12.8 oz (87.5 kg)  12/04/20 182 lb (82.6 kg)  09/18/20 163 lb 3.2 oz (74 kg)    General: Well nourished, well developed, in no acute distress Head: Atraumatic, normal size  Eyes: PEERLA, EOMI  Neck: Supple, no JVD Endocrine: No thryomegaly Cardiac: Normal S1, S2; RRR; no murmurs, rubs, or gallops Lungs: Clear to  auscultation bilaterally, no wheezing, rhonchi or rales  Abd: Soft, nontender, no hepatomegaly  Ext: No edema, pulses 2+ Musculoskeletal: No deformities, BUE and BLE strength normal and equal Skin: Warm and dry, no rashes   Neuro: Alert and oriented to person, place, time, and situation, CNII-XII grossly intact, no focal deficits  Psych: Normal mood and affect   ASSESSMENT:   DASHEA MCMULLAN is a 40 y.o. female who presents for the following: 1. Chronic systolic heart failure (HCC)   2. LBBB (left bundle branch block)   3. Primary hypertension     PLAN:   1. Chronic systolic heart failure (HCC) 2. LBBB (left bundle branch  block) 3. Primary hypertension -Diagnosed with systolic heart failure on 08/20/2020.  EF 25-30%.  Normal coronary arteries.  Her EF has not recovered despite 6 months of guideline directed medical therapy.  This is included Coreg, Entresto, Aldactone and Jardiance. -She has had trouble getting her Entresto 97-103 mg twice daily.  We will make sure we can get this for her. -She is euvolemic.  She is taking Lasix 40 mg daily.  She will continue this. -I suspect the etiology is hypertension as well as exacerbated by significant LV dyssynchrony.  She has a left bundle branch block with a QRS duration of 186 ms.  I think she will benefit from BiV pacing.  She will also need a defibrillator.  We discussed this in the office today.  I will refer her to Steffanie Dunn for CRT-D evaluation. -We will recheck her BMP and CBC today.  She was anemic and this was attributed to iron deficiency.  We need to make sure this is resolved.  Disposition: Return in about 4 months (around 07/06/2021).  Medication Adjustments/Labs and Tests Ordered: Current medicines are reviewed at length with the patient today.  Concerns regarding medicines are outlined above.  No orders of the defined types were placed in this encounter.  No orders of the defined types were placed in this  encounter.   There are no Patient Instructions on file for this visit.   Time Spent with Patient: I have spent a total of 35 minutes with patient reviewing hospital notes, telemetry, EKGs, labs and examining the patient as well as establishing an assessment and plan that was discussed with the patient.  > 50% of time was spent in direct patient care.  Signed, Lenna Gilford. Flora Lipps, MD, [facility]  [facility]  [address] [place], [place] [address] [phone]  03/05/2021 10:09 AM

## 2021-03-05 ENCOUNTER — Encounter: Payer: Self-pay | Admitting: Cardiovascular Disease

## 2021-03-05 ENCOUNTER — Other Ambulatory Visit: Payer: Self-pay

## 2021-03-05 ENCOUNTER — Ambulatory Visit (INDEPENDENT_AMBULATORY_CARE_PROVIDER_SITE_OTHER): Payer: Self-pay | Admitting: Cardiovascular Disease

## 2021-03-05 VITALS — BP 142/86 | HR 64 | Ht 65.0 in | Wt 192.8 lb

## 2021-03-05 DIAGNOSIS — I5022 Chronic systolic (congestive) heart failure: Secondary | ICD-10-CM

## 2021-03-05 DIAGNOSIS — I10 Essential (primary) hypertension: Secondary | ICD-10-CM

## 2021-03-05 DIAGNOSIS — I447 Left bundle-branch block, unspecified: Secondary | ICD-10-CM

## 2021-03-05 LAB — CBC
Hematocrit: 27.7 % — ABNORMAL LOW (ref 34.0–46.6)
Hemoglobin: 8.2 g/dL — ABNORMAL LOW (ref 11.1–15.9)
MCH: 22.2 pg — ABNORMAL LOW (ref 26.6–33.0)
MCHC: 29.6 g/dL — ABNORMAL LOW (ref 31.5–35.7)
MCV: 75 fL — ABNORMAL LOW (ref 79–97)
Platelets: 297 10*3/uL (ref 150–450)
RBC: 3.7 x10E6/uL — ABNORMAL LOW (ref 3.77–5.28)
RDW: 16.9 % — ABNORMAL HIGH (ref 11.7–15.4)
WBC: 2.8 10*3/uL — ABNORMAL LOW (ref 3.4–10.8)

## 2021-03-05 LAB — BASIC METABOLIC PANEL
BUN/Creatinine Ratio: 12 (ref 9–23)
BUN: 9 mg/dL (ref 6–24)
CO2: 23 mmol/L (ref 20–29)
Calcium: 8.8 mg/dL (ref 8.7–10.2)
Chloride: 101 mmol/L (ref 96–106)
Creatinine, Ser: 0.77 mg/dL (ref 0.57–1.00)
Glucose: 79 mg/dL (ref 65–99)
Potassium: 4.1 mmol/L (ref 3.5–5.2)
Sodium: 137 mmol/L (ref 134–144)
eGFR: 100 mL/min/{1.73_m2} (ref >59)

## 2021-03-05 NOTE — Patient Instructions (Addendum)
Medication Instructions:  The current medical regimen is effective;  continue present plan and medications.  *If you need a refill on your cardiac medications before your next appointment, please call your pharmacy*  Labs: BMET, CBC today    Follow-Up: At [facility], you and your health needs are our priority.  As part of our continuing mission to provide you with exceptional heart care, we have created designated Provider Care Teams.  These Care Teams include your primary Cardiologist (physician) and Advanced Practice Providers (APPs -  Physician Assistants and Nurse Practitioners) who all work together to provide you with the care you need, when you need it.  We recommend signing up for the patient portal called "MyChart".  Sign up information is provided on this After Visit Summary.  MyChart is used to connect with patients for Virtual Visits (Telemedicine).  Patients are able to view lab/test results, encounter notes, upcoming appointments, etc.  Non-urgent messages can be sent to your provider as well.   To learn more about what you can do with MyChart, go to [email or web address].    Your next appointment:   4 month(s)  The format for your next appointment:   In Person  Provider:   Lennie Odor, MD

## 2021-03-06 ENCOUNTER — Other Ambulatory Visit: Payer: Self-pay | Admitting: Internal Medicine

## 2021-03-06 ENCOUNTER — Other Ambulatory Visit: Payer: Self-pay

## 2021-03-06 ENCOUNTER — Telehealth: Payer: Self-pay

## 2021-03-06 DIAGNOSIS — D500 Iron deficiency anemia secondary to blood loss (chronic): Secondary | ICD-10-CM

## 2021-03-06 MED ORDER — FERROUS SULFATE 325 (65 FE) MG PO TABS: ORAL_TABLET | Freq: Every day | ORAL | 1 refills | Status: AC

## 2021-03-06 MED ORDER — SACUBITRIL-VALSARTAN 97-103 MG PO TABS
1.0000 | ORAL_TABLET | Freq: Two times a day (BID) | ORAL | 3 refills | Status: DC
  Filled 2021-03-06: qty 60, 30d supply, fill #0

## 2021-03-06 NOTE — Progress Notes (Signed)
Let patient know that I received her lab results from Dr. Bufford Buttner.  She is still anemic and more so than she was when last checked in November.  Is she taking the iron supplement daily?  She needs to take it daily.  I will send a refill to her pharmacy.  Also she was supposed to follow-up with me in January for Pap smear.  Please get her scheduled.

## 2021-03-06 NOTE — Telephone Encounter (Signed)
Contacted pt to go over lab results pt is aware and doesn't have any questions or concerns.  Pt states she hasn't been taking iron pills everyday but she will start   Pt states she will call back to reschedule pap

## 2021-03-17 ENCOUNTER — Other Ambulatory Visit: Payer: Self-pay

## 2021-03-27 ENCOUNTER — Other Ambulatory Visit: Payer: Self-pay

## 2021-03-27 MED FILL — Furosemide Tab 40 MG: ORAL | 30 days supply | Qty: 30 | Fill #1 | Status: AC

## 2021-04-14 ENCOUNTER — Other Ambulatory Visit: Payer: Self-pay

## 2021-04-14 MED FILL — Spironolactone Tab 25 MG: ORAL | 30 days supply | Qty: 15 | Fill #2 | Status: AC

## 2021-04-15 ENCOUNTER — Other Ambulatory Visit (HOSPITAL_BASED_OUTPATIENT_CLINIC_OR_DEPARTMENT_OTHER): Payer: Self-pay

## 2021-04-15 MED ORDER — SACUBITRIL-VALSARTAN 97-103 MG PO TABS: 1.0000 | ORAL_TABLET | Freq: Two times a day (BID) | ORAL | 3 refills | Status: DC

## 2021-04-16 ENCOUNTER — Other Ambulatory Visit: Payer: Self-pay

## 2021-04-29 ENCOUNTER — Other Ambulatory Visit: Payer: Self-pay

## 2021-04-29 MED FILL — Carvedilol Tab 25 MG: ORAL | 30 days supply | Qty: 60 | Fill #1 | Status: AC

## 2021-05-01 ENCOUNTER — Other Ambulatory Visit: Payer: Self-pay

## 2021-05-01 MED FILL — Furosemide Tab 40 MG: ORAL | 30 days supply | Qty: 30 | Fill #2 | Status: AC

## 2021-05-07 NOTE — Progress Notes (Signed)
Electrophysiology Office Note:    Date:  05/08/2021   ID:  SANAII CAPORASO, DOB April 12, 1981, MRN [id number]  PCP:  Marcine Matar, MD  [facility] Cardiologist:  Reatha Harps, MD  [facility] Electrophysiologist:  Lanier Prude, MD   Referring MD: Sande Rives, *   Chief Complaint: chronic systolic HF/NICM  History of Present Illness:    Isabella Sanders is a 40 y.o. female who presents for an evaluation of NICM at the request of Dr Flora Lipps. Their medical history includes LBBB, HTN, chronic systolic HF, tobacco abuse.  She last saw Dr Flora Lipps on 03/05/2021. At that appointment she had stopped smoking. Given a persistently decreased EF despite GDMT and a LBBB she is referred for consideration of CRT-D.  She tells me that her sister died suddenly at age 32 while at [facility].  An autopsy was performed and she thinks they told her that there was a hypertrophic cardiomyopathy present.  No other defibrillator is present in the family.  No drowning.  No other premature sudden cardiac deaths noted in the family history.   Past Medical History:  Diagnosis Date   CHF (congestive heart failure) (HCC)    Hypertension    Tobacco abuse     Past Surgical History:  Procedure Laterality Date   CARDIAC CATHETERIZATION     CESAREAN SECTION     CHOLECYSTECTOMY     RIGHT/LEFT HEART CATH AND CORONARY ANGIOGRAPHY N/A 08/22/2020   Procedure: RIGHT/LEFT HEART CATH AND CORONARY ANGIOGRAPHY;  Surgeon: Corky Crafts, MD;  Location: [facility] INVASIVE CV LAB;  Service: Cardiovascular;  Laterality: N/A;   TUBAL LIGATION      Current Medications: Current Meds  Medication Sig   carvedilol (COREG) 25 MG tablet TAKE 1 TABLET (25 MG TOTAL) BY MOUTH 2 (TWO) TIMES DAILY WITH A MEAL.   empagliflozin (JARDIANCE) 10 MG TABS tablet TAKE 1 TABLET (10 MG TOTAL) BY MOUTH DAILY BEFORE BREAKFAST.   ferrous sulfate 325 (65 FE) MG tablet TAKE 1 TABLET (325 MG TOTAL) BY MOUTH DAILY WITH  BREAKFAST.   furosemide (LASIX) 40 MG tablet TAKE 1 TABLET (40 MG TOTAL) BY MOUTH DAILY.   potassium chloride SA (KLOR-CON) 20 MEQ tablet TAKE 2 TABLETS (40 MEQ TOTAL) BY MOUTH DAILY. TAKE WITH YOUR LASIX.   sacubitril-valsartan (ENTRESTO) 97-103 MG TAKE 1 TABLET BY MOUTH 2 (TWO) TIMES DAILY.   spironolactone (ALDACTONE) 25 MG tablet TAKE 0.5 TABLETS (12.5 MG TOTAL) BY MOUTH DAILY.     Allergies:   Patient has no known allergies.   Social History   Socioeconomic History   Marital status: Single    Spouse name: Not on file   Number of children: 2   Years of education: Not on file   Highest education level: 12th grade  Occupational History   Occupation: Restaurant  Tobacco Use   Smoking status: Former    Years: 10.00    Types: Cigarettes   Smokeless tobacco: Never  Vaping Use   Vaping Use: Never used  Substance and Sexual Activity   Alcohol use: Never   Drug use: Not Currently   Sexual activity: Not on file  Other Topics Concern   Not on file  Social History Narrative   Not on file   Social Determinants of Health   Financial Resource Strain: Not on file  Food Insecurity: Not on file  Transportation Needs: Not on file  Physical Activity: Not on file  Stress: Not on file  Social Connections:  Not on file     Family History: The patient's family history includes Heart attack in her sister; Sudden Cardiac Death (age of onset: 86) in her sister.  ROS:   Please see the history of present illness.    All other systems reviewed and are negative.  EKGs/Labs/Other Studies Reviewed:    The following studies were reviewed today:  02/23/2021 Echo personally reviewed LV function severely decreased, 30% Dyssynchrony noted RV normal Moderate MR  08/22/2020 LHC No significant CAD    EKG:  The ekg ordered today demonstrates sinus rhythm with a bundle branch block.  QRS duration 170 ms.  Recent Labs: 08/20/2020: B Natriuretic Peptide 2,726.1; TSH 2.161 08/21/2020:  Magnesium 1.9 08/26/2020: ALT 20 03/05/2021: BUN 9; Creatinine, Ser 0.77; Hemoglobin 8.2; Platelets 297; Potassium 4.1; Sodium 137  Recent Lipid Panel    Component Value Date/Time   CHOL 122 08/21/2020 0209   TRIG 40 08/21/2020 0209   HDL 32 (L) 08/21/2020 0209   CHOLHDL 3.8 08/21/2020 0209   VLDL 8 08/21/2020 0209   LDLCALC 82 08/21/2020 0209    Physical Exam:    VS:  BP (!) 146/80   Pulse (!) 54   Ht 5\' 5"  (1.651 m)   Wt 191 lb (86.6 kg)   SpO2 99%   BMI 31.78 kg/m     Wt Readings from Last 3 Encounters:  05/08/21 191 lb (86.6 kg)  03/05/21 192 lb 12.8 oz (87.5 kg)  12/04/20 182 lb (82.6 kg)     GEN: Well nourished, well developed in no acute distress.  Obese HEENT: Normal NECK: No JVD; No carotid bruits LYMPHATICS: No lymphadenopathy CARDIAC: RRR, no murmurs, rubs, gallops RESPIRATORY:  Clear to auscultation without rales, wheezing or rhonchi  ABDOMEN: Soft, non-tender, non-distended MUSCULOSKELETAL:  No edema; No deformity  SKIN: Warm and dry NEUROLOGIC:  Alert and oriented x 3 PSYCHIATRIC:  Normal affect   ASSESSMENT:    1. Chronic systolic heart failure (HCC)   2. NICM (nonischemic cardiomyopathy) (HCC)   3. LBBB (left bundle branch block)   4. Primary hypertension    PLAN:    In order of problems listed above:  1. Chronic systolic heart failure (HCC) NYHA class III.  Low bundle-branch block.  Warm and euvolemic today.  Continue Coreg, digoxin, Jardiance, Lasix, Entresto, spironolactone.  I think she is a good candidate for CRT-D therapy given her left bundle branch block and nonischemic etiology.  I discussed the CRT-D implant in detail with the patient and her family who is with her today.  I discussed the risk, recovery and efficacy.  The patient has an non-ischemic CM (EF 30%), NYHA Class III CHF, and LBBB.  She is referred by Dr 12/06/20 for risk stratification of sudden death and consideration of ICD implantation.  At this time, she meets criteria  for ICD implantation for primary prevention of sudden death.  I have had a thorough discussion with the patient reviewing options.  The patient and their family (if available) have had opportunities to ask questions and have them answered. The patient and I have decided together through a shared decision making process to implant CRT-D at this time.   Risks, benefits, alternatives to ICD implantation were discussed in detail with the patient today. The patient understands that the risks include but are not limited to bleeding, infection, pneumothorax, perforation, tamponade, vascular damage, renal failure, MI, stroke, death, inappropriate shocks, and lead dislodgement and wishes to proceed.  We will therefore schedule device implantation at  the next available time.    Before we implant the CRT-D, we will plan to have a cardiac MRI to further characterize the myocardium.  I will also refer to genetics given her strong family history of sudden cardiac death.  We will plan for [facility] device  2. NICM (nonischemic cardiomyopathy) (HCC) See #1  3. LBBB (left bundle branch block) See #1  4. Primary hypertension Controlled on current medical therapy.     Total time spent with patient today  65 minutes. This includes reviewing records, evaluating the patient and coordinating care.  Medication Adjustments/Labs and Tests Ordered: Current medicines are reviewed at length with the patient today.  Concerns regarding medicines are outlined above.  Orders Placed This Encounter  Procedures   MR CARDIAC MORPHOLOGY W WO CONTRAST   EKG 12-Lead    No orders of the defined types were placed in this encounter.    Signed, Rossie Muskrat. Lalla Brothers, MD, [facility], [facility] 05/08/2021 11:01 AM    Electrophysiology [facility]

## 2021-05-08 ENCOUNTER — Other Ambulatory Visit: Payer: Self-pay

## 2021-05-08 ENCOUNTER — Ambulatory Visit (INDEPENDENT_AMBULATORY_CARE_PROVIDER_SITE_OTHER): Payer: Self-pay | Admitting: Cardiology

## 2021-05-08 ENCOUNTER — Encounter: Payer: Self-pay | Admitting: Cardiology

## 2021-05-08 VITALS — BP 146/80 | HR 54 | Ht 65.0 in | Wt 191.0 lb

## 2021-05-08 DIAGNOSIS — I447 Left bundle-branch block, unspecified: Secondary | ICD-10-CM

## 2021-05-08 DIAGNOSIS — I10 Essential (primary) hypertension: Secondary | ICD-10-CM

## 2021-05-08 DIAGNOSIS — I5022 Chronic systolic (congestive) heart failure: Secondary | ICD-10-CM

## 2021-05-08 DIAGNOSIS — I428 Other cardiomyopathies: Secondary | ICD-10-CM

## 2021-05-08 NOTE — Patient Instructions (Addendum)
Medication Instructions:  Your physician recommends that you continue on your current medications as directed. Please refer to the Current Medication list given to you today. *If you need a refill on your cardiac medications before your next appointment, please call your pharmacy*  Lab Work: None ordered. If you have labs (blood work) drawn today and your tests are completely normal, you will receive your results only by: MyChart Message (if you have MyChart) OR A paper copy in the mail If you have any lab test that is abnormal or we need to change your treatment, we will call you to review the results.  Testing/Procedures: You will be scheduled for a cardiac MRI.  Follow-Up: At [facility], you and your health needs are our priority.  As part of our continuing mission to provide you with exceptional heart care, we have created designated Provider Care Teams.  These Care Teams include your primary Cardiologist (physician) and Advanced Practice Providers (APPs -  Physician Assistants and Nurse Practitioners) who all work together to provide you with the care you need, when you need it.  Your next appointment:    After your cardiac MRI I will contact you to schedule your biventricular ICD.

## 2021-05-13 ENCOUNTER — Other Ambulatory Visit: Payer: Self-pay

## 2021-05-13 ENCOUNTER — Other Ambulatory Visit: Payer: Self-pay | Admitting: Internal Medicine

## 2021-05-13 DIAGNOSIS — I5040 Unspecified combined systolic (congestive) and diastolic (congestive) heart failure: Secondary | ICD-10-CM

## 2021-05-13 MED ORDER — SPIRONOLACTONE 25 MG PO TABS
25.0000 mg | ORAL_TABLET | Freq: Every day | ORAL | 1 refills | Status: DC
  Filled 2021-05-13 – 2021-05-22 (×2): qty 15, 15d supply, fill #0

## 2021-05-13 NOTE — Telephone Encounter (Signed)
Patient called, left VM to return the call to the office to schedule an OV for follow up.  ? ?

## 2021-05-14 ENCOUNTER — Other Ambulatory Visit: Payer: Self-pay

## 2021-05-22 ENCOUNTER — Other Ambulatory Visit: Payer: Self-pay

## 2021-06-04 ENCOUNTER — Other Ambulatory Visit: Payer: Self-pay

## 2021-06-04 MED FILL — Furosemide Tab 40 MG: ORAL | 30 days supply | Qty: 30 | Fill #3 | Status: AC

## 2021-06-11 ENCOUNTER — Other Ambulatory Visit (HOSPITAL_COMMUNITY): Payer: Self-pay | Admitting: Emergency Medicine

## 2021-06-11 ENCOUNTER — Other Ambulatory Visit: Payer: Self-pay

## 2021-06-11 ENCOUNTER — Telehealth (HOSPITAL_COMMUNITY): Payer: Self-pay | Admitting: Emergency Medicine

## 2021-06-11 DIAGNOSIS — Z01812 Encounter for preprocedural laboratory examination: Secondary | ICD-10-CM

## 2021-06-11 MED FILL — Carvedilol Tab 25 MG: ORAL | 30 days supply | Qty: 60 | Fill #2 | Status: AC

## 2021-06-11 NOTE — Telephone Encounter (Signed)
Attempted to call patient regarding upcoming cardiac MR appointment. Left message on voicemail with name and callback number Rockwell Alexandria RN Navigator Cardiac Imaging [facility] [phone] Office [phone] Cell  Courtesy call to remind her to get CBC prior to MR

## 2021-06-16 ENCOUNTER — Ambulatory Visit (HOSPITAL_COMMUNITY): Admission: RE | Admit: 2021-06-16 | Payer: Self-pay | Source: Ambulatory Visit

## 2021-06-23 ENCOUNTER — Encounter: Payer: Self-pay | Admitting: Genetic Counselor

## 2021-07-06 ENCOUNTER — Other Ambulatory Visit: Payer: Self-pay

## 2021-07-06 MED FILL — Furosemide Tab 40 MG: ORAL | 30 days supply | Qty: 30 | Fill #4 | Status: AC

## 2021-07-09 ENCOUNTER — Other Ambulatory Visit: Payer: Self-pay

## 2021-07-09 NOTE — Progress Notes (Signed)
Cardiology Office Note:   Date:  07/10/2021  NAME:  Isabella Sanders    MRN: [id number] DOB:  [birth year]   PCP:  Marcine Matar, MD  Cardiologist:  Reatha Harps, MD  Electrophysiologist:  Lanier Prude, MD   Referring MD: Marcine Matar, MD   Chief Complaint  Patient presents with   Follow-up   History of Present Illness:   Isabella Sanders is a 40 y.o. female with a hx of systolic HF, LBBB, HTN who presents for follow-up.  Blood pressure elevated today 163/100.  She reports she just took her medication.  She actually went to the wrong office.  She did not realize she was going to be seen in the [place] facility.  She reports no significant chest pain or trouble breathing.  She is out of Aldactone.  She is continued on Coreg, Entresto Aldactone and Jardiance.  She is taking Lasix 40 mg daily.  No significant volume overload on exam.  She was seen by EP.  I do believe her cardiomyopathy is related to a very wide left bundle branch block.  They have plans for cardiac MRI and genetic testing prior to BiV ICD.  She was unable to complete her cardiac MRI due to scheduling issues.  I have reach back out to them to get this scheduled.  Overall seems to be stable and denies any symptoms in office today.  Problem List Systolic HF -EF 25-30% 08/2020 -EF 30-35% 12/03/2020 -EF 30-35% 02/23/2021 -LHC normal 08/22/2020 2. LBBB -QRS 186 ms 3. HTN 4. Tobacco abuse   Past Medical History: Past Medical History:  Diagnosis Date   CHF (congestive heart failure) (HCC)    Hypertension    Tobacco abuse     Past Surgical History: Past Surgical History:  Procedure Laterality Date   CARDIAC CATHETERIZATION     CESAREAN SECTION     CHOLECYSTECTOMY     RIGHT/LEFT HEART CATH AND CORONARY ANGIOGRAPHY N/A 08/22/2020   Procedure: RIGHT/LEFT HEART CATH AND CORONARY ANGIOGRAPHY;  Surgeon: Corky Crafts, MD;  Location: [facility] INVASIVE CV LAB;  Service: Cardiovascular;  Laterality:  N/A;   TUBAL LIGATION      Current Medications: Current Meds  Medication Sig   ferrous sulfate 325 (65 FE) MG tablet TAKE 1 TABLET (325 MG TOTAL) BY MOUTH DAILY WITH BREAKFAST.   [DISCONTINUED] carvedilol (COREG) 25 MG tablet TAKE 1 TABLET (25 MG TOTAL) BY MOUTH 2 (TWO) TIMES DAILY WITH A MEAL.   [DISCONTINUED] empagliflozin (JARDIANCE) 10 MG TABS tablet TAKE 1 TABLET (10 MG TOTAL) BY MOUTH DAILY BEFORE BREAKFAST.   [DISCONTINUED] furosemide (LASIX) 40 MG tablet TAKE 1 TABLET (40 MG TOTAL) BY MOUTH DAILY.   [DISCONTINUED] potassium chloride SA (KLOR-CON) 20 MEQ tablet TAKE 2 TABLETS (40 MEQ TOTAL) BY MOUTH DAILY. TAKE WITH YOUR LASIX.   [DISCONTINUED] sacubitril-valsartan (ENTRESTO) 97-103 MG TAKE 1 TABLET BY MOUTH 2 (TWO) TIMES DAILY.   [DISCONTINUED] spironolactone (ALDACTONE) 25 MG tablet Take 1 tablet (25 mg total) by mouth daily. OFFICE VISIT NEEDED FOR ADDITIONAL REFILLS     Allergies:    Patient has no known allergies.   Social History: Social History   Socioeconomic History   Marital status: Single    Spouse name: Not on file   Number of children: 2   Years of education: Not on file   Highest education level: 12th grade  Occupational History   Occupation: Restaurant  Tobacco Use   Smoking status: Former  Years: 10.00    Types: Cigarettes   Smokeless tobacco: Never  Vaping Use   Vaping Use: Never used  Substance and Sexual Activity   Alcohol use: Never   Drug use: Not Currently   Sexual activity: Not on file  Other Topics Concern   Not on file  Social History Narrative   Not on file   Social Determinants of Health   Financial Resource Strain: Not on file  Food Insecurity: Not on file  Transportation Needs: Not on file  Physical Activity: Not on file  Stress: Not on file  Social Connections: Not on file     Family History: The patient's family history includes Heart attack in her sister; Sudden Cardiac Death (age of onset: 33) in her sister.  ROS:    All other ROS reviewed and negative. Pertinent positives noted in the HPI.     EKGs/Labs/Other Studies Reviewed:   The following studies were personally reviewed by me today:  TTE 02/23/2021  EF 30-35%  Recent Labs: 08/20/2020: B Natriuretic Peptide 2,726.1; TSH 2.161 08/21/2020: Magnesium 1.9 08/26/2020: ALT 20 03/05/2021: BUN 9; Creatinine, Ser 0.77; Hemoglobin 8.2; Platelets 297; Potassium 4.1; Sodium 137   Recent Lipid Panel    Component Value Date/Time   CHOL 122 08/21/2020 0209   TRIG 40 08/21/2020 0209   HDL 32 (L) 08/21/2020 0209   CHOLHDL 3.8 08/21/2020 0209   VLDL 8 08/21/2020 0209   LDLCALC 82 08/21/2020 0209    Physical Exam:   VS:  BP (!) 163/100   Pulse 75   Ht 5\' 7"  (1.702 m)   Wt 192 lb 9.6 oz (87.4 kg)   SpO2 99%   BMI 30.17 kg/m    Wt Readings from Last 3 Encounters:  07/10/21 192 lb 9.6 oz (87.4 kg)  05/08/21 191 lb (86.6 kg)  03/05/21 192 lb 12.8 oz (87.5 kg)    General: Well nourished, well developed, in no acute distress Head: Atraumatic, normal size  Eyes: PEERLA, EOMI  Neck: Supple, no JVD Endocrine: No thryomegaly Cardiac: Normal S1, S2; RRR; no murmurs, rubs, or gallops Lungs: Clear to auscultation bilaterally, no wheezing, rhonchi or rales  Abd: Soft, nontender, no hepatomegaly  Ext: No edema, pulses 2+ Musculoskeletal: No deformities, BUE and BLE strength normal and equal Skin: Warm and dry, no rashes   Neuro: Alert and oriented to person, place, time, and situation, CNII-XII grossly intact, no focal deficits  Psych: Normal mood and affect   ASSESSMENT:   Isabella Sanders is a 40 y.o. female who presents for the following: 1. Chronic systolic heart failure (HCC)   2. NICM (nonischemic cardiomyopathy) (HCC)   3. LBBB (left bundle branch block)   4. Primary hypertension   5. Combined systolic and diastolic congestive heart failure, unspecified HF chronicity (HCC)     PLAN:   1. Chronic systolic heart failure (HCC) 2. NICM  (nonischemic cardiomyopathy) (HCC) 3. LBBB (left bundle branch block) -Diagnosed with systolic heart failure in November 2021.  This was in the setting of uncontrolled hypertension as well as a left bundle branch block with QRS of 186 ms.  Left heart cath with no significant CAD.  This is a nonischemic cardiomyopathy.   -She has been optimized on Coreg 25 mg twice daily, Entresto 97-103 mg twice daily, Aldactone 25 mg daily.  She is also on Jardiance 10 mg daily.  She takes Lasix 40 mg daily with potassium supplement.  She is euvolemic on exam without significant heart failure symptoms.   -  Her EF has not recovered and is now 30-35%.  I do believe her cardiomyopathy is likely related to dyssynchrony from her left bundle branch block with 186 ms.  Her echo shows very severe LV dyssynchrony.  She has been evaluated by EP.  They would like to pursue a cardiac MRI and genetic testing prior to BiV ICD.  Although I doubt she has a genetic cardiomyopathy I think this is okay to proceed with testing.  I do not believe she will have any infiltrative disorder on CMR.  However likely good to go ahead and get this prior to BiV ICD implantation.  4. Primary hypertension -Slightly elevated today.  She just took her medications.  Overall stable.  Disposition: Return in about 3 months (around 10/09/2021).  Medication Adjustments/Labs and Tests Ordered: Current medicines are reviewed at length with the patient today.  Concerns regarding medicines are outlined above.  No orders of the defined types were placed in this encounter.  Meds ordered this encounter  Medications   spironolactone (ALDACTONE) 25 MG tablet    Sig: Take 1 tablet (25 mg total) by mouth daily. OFFICE VISIT NEEDED FOR ADDITIONAL REFILLS    Dispense:  15 tablet    Refill:  1   sacubitril-valsartan (ENTRESTO) 97-103 MG    Sig: TAKE 1 TABLET BY MOUTH 2 (TWO) TIMES DAILY.    Dispense:  60 tablet    Refill:  3   potassium chloride SA (KLOR-CON) 20  MEQ tablet    Sig: TAKE 2 TABLETS (40 MEQ TOTAL) BY MOUTH DAILY. TAKE WITH YOUR LASIX.    Dispense:  180 tablet    Refill:  2   furosemide (LASIX) 40 MG tablet    Sig: TAKE 1 TABLET (40 MG TOTAL) BY MOUTH DAILY.    Dispense:  60 tablet    Refill:  2   empagliflozin (JARDIANCE) 10 MG TABS tablet    Sig: TAKE 1 TABLET (10 MG TOTAL) BY MOUTH DAILY BEFORE BREAKFAST.    Dispense:  90 tablet    Refill:  1   carvedilol (COREG) 25 MG tablet    Sig: TAKE 1 TABLET (25 MG TOTAL) BY MOUTH 2 (TWO) TIMES DAILY WITH A MEAL.    Dispense:  60 tablet    Refill:  6     Patient Instructions  Medication Instructions:  The current medical regimen is effective;  continue present plan and medications.  *If you need a refill on your cardiac medications before your next appointment, please call your pharmacy*   Follow-Up: At [facility], you and your health needs are our priority.  As part of our continuing mission to provide you with exceptional heart care, we have created designated Provider Care Teams.  These Care Teams include your primary Cardiologist (physician) and Advanced Practice Providers (APPs -  Physician Assistants and Nurse Practitioners) who all work together to provide you with the care you need, when you need it.  We recommend signing up for the patient portal called "MyChart".  Sign up information is provided on this After Visit Summary.  MyChart is used to connect with patients for Virtual Visits (Telemedicine).  Patients are able to view lab/test results, encounter notes, upcoming appointments, etc.  Non-urgent messages can be sent to your provider as well.   To learn more about what you can do with MyChart, go to [email or web address].    Your next appointment:   6 month(s)  The format for your next appointment:   In Person  Provider:  Lennie Odor, MD       Time Spent with Patient: I have spent a total of 35 minutes with patient reviewing hospital notes, telemetry,  EKGs, labs and examining the patient as well as establishing an assessment and plan that was discussed with the patient.  > 50% of time was spent in direct patient care.  Signed, Lenna Gilford. Flora Lipps, MD, [facility]  [facility]  [address] [place], [place] [address] [phone]  07/10/2021 10:06 AM

## 2021-07-10 ENCOUNTER — Ambulatory Visit (INDEPENDENT_AMBULATORY_CARE_PROVIDER_SITE_OTHER): Payer: Self-pay | Admitting: Cardiovascular Disease

## 2021-07-10 ENCOUNTER — Encounter (HOSPITAL_BASED_OUTPATIENT_CLINIC_OR_DEPARTMENT_OTHER): Payer: Self-pay | Admitting: Cardiovascular Disease

## 2021-07-10 ENCOUNTER — Other Ambulatory Visit: Payer: Self-pay

## 2021-07-10 VITALS — BP 163/100 | HR 75 | Ht 67.0 in | Wt 192.6 lb

## 2021-07-10 DIAGNOSIS — I5022 Chronic systolic (congestive) heart failure: Secondary | ICD-10-CM

## 2021-07-10 DIAGNOSIS — I447 Left bundle-branch block, unspecified: Secondary | ICD-10-CM

## 2021-07-10 DIAGNOSIS — I5040 Unspecified combined systolic (congestive) and diastolic (congestive) heart failure: Secondary | ICD-10-CM

## 2021-07-10 DIAGNOSIS — I428 Other cardiomyopathies: Secondary | ICD-10-CM

## 2021-07-10 DIAGNOSIS — I10 Essential (primary) hypertension: Secondary | ICD-10-CM

## 2021-07-10 MED ORDER — CARVEDILOL 25 MG PO TABS: ORAL_TABLET | Freq: Two times a day (BID) | ORAL | 6 refills | Status: DC

## 2021-07-10 MED ORDER — FUROSEMIDE 40 MG PO TABS: ORAL_TABLET | Freq: Every day | ORAL | 2 refills | Status: DC

## 2021-07-10 MED ORDER — POTASSIUM CHLORIDE CRYS ER 20 MEQ PO TBCR: EXTENDED_RELEASE_TABLET | ORAL | 2 refills | Status: AC

## 2021-07-10 MED ORDER — EMPAGLIFLOZIN 10 MG PO TABS: ORAL_TABLET | Freq: Every day | ORAL | 1 refills | Status: DC

## 2021-07-10 MED ORDER — SPIRONOLACTONE 25 MG PO TABS: 25.0000 mg | ORAL_TABLET | Freq: Every day | ORAL | 1 refills | Status: DC

## 2021-07-10 MED ORDER — SACUBITRIL-VALSARTAN 97-103 MG PO TABS: 1.0000 | ORAL_TABLET | Freq: Two times a day (BID) | ORAL | 3 refills | Status: DC

## 2021-07-10 NOTE — Patient Instructions (Signed)
Medication Instructions:  The current medical regimen is effective;  continue present plan and medications.  *If you need a refill on your cardiac medications before your next appointment, please call your pharmacy*   Follow-Up: At [facility], you and your health needs are our priority.  As part of our continuing mission to provide you with exceptional heart care, we have created designated Provider Care Teams.  These Care Teams include your primary Cardiologist (physician) and Advanced Practice Providers (APPs -  Physician Assistants and Nurse Practitioners) who all work together to provide you with the care you need, when you need it.  We recommend signing up for the patient portal called "MyChart".  Sign up information is provided on this After Visit Summary.  MyChart is used to connect with patients for Virtual Visits (Telemedicine).  Patients are able to view lab/test results, encounter notes, upcoming appointments, etc.  Non-urgent messages can be sent to your provider as well.   To learn more about what you can do with MyChart, go to [email or web address].    Your next appointment:   6 month(s)  The format for your next appointment:   In Person  Provider:   Lonsdale O'Neal, MD      

## 2021-07-12 ENCOUNTER — Encounter (HOSPITAL_BASED_OUTPATIENT_CLINIC_OR_DEPARTMENT_OTHER): Payer: Self-pay

## 2021-08-10 ENCOUNTER — Telehealth (HOSPITAL_COMMUNITY): Payer: Self-pay | Admitting: *Deleted

## 2021-08-10 ENCOUNTER — Other Ambulatory Visit (HOSPITAL_COMMUNITY): Payer: Self-pay | Admitting: *Deleted

## 2021-08-10 DIAGNOSIS — Z01812 Encounter for preprocedural laboratory examination: Secondary | ICD-10-CM

## 2021-08-10 NOTE — Telephone Encounter (Signed)
Reaching out to patient to offer assistance regarding upcoming cardiac imaging study; pt verbalizes understanding of appt date/time, parking situation and where to check in, and verified current allergies; name and call back number provided for further questions should they arise  Larey Brick RN Navigator Cardiac Imaging [facility] [phone] office [phone] cell  Patient aware to get blood work prior to cardiac MRI.

## 2021-08-12 ENCOUNTER — Other Ambulatory Visit: Payer: Self-pay

## 2021-08-12 ENCOUNTER — Ambulatory Visit (HOSPITAL_COMMUNITY)
Admission: RE | Admit: 2021-08-12 | Discharge: 2021-08-12 | Disposition: A | Discharge status: Home / Self Care | Payer: Self-pay | Source: Ambulatory Visit | Attending: Cardiology | Admitting: Cardiology

## 2021-08-12 DIAGNOSIS — I428 Other cardiomyopathies: Secondary | ICD-10-CM | POA: Insufficient documentation

## 2021-08-12 DIAGNOSIS — I10 Essential (primary) hypertension: Secondary | ICD-10-CM | POA: Diagnosis present

## 2021-08-12 DIAGNOSIS — I447 Left bundle-branch block, unspecified: Secondary | ICD-10-CM | POA: Diagnosis present

## 2021-08-12 DIAGNOSIS — I5022 Chronic systolic (congestive) heart failure: Secondary | ICD-10-CM | POA: Insufficient documentation

## 2021-08-12 LAB — CBC
Hematocrit: 35.6 % (ref 34.0–46.6)
Hemoglobin: 12.1 g/dL (ref 11.1–15.9)
MCH: 31.5 pg (ref 26.6–33.0)
MCHC: 34 g/dL (ref 31.5–35.7)
MCV: 93 fL (ref 79–97)
Platelets: 241 10*3/uL (ref 150–450)
RBC: 3.84 x10E6/uL (ref 3.77–5.28)
RDW: 12.8 % (ref 11.7–15.4)
WBC: 3.7 10*3/uL (ref 3.4–10.8)

## 2021-08-12 MED ORDER — GADOBUTROL 1 MMOL/ML IV SOLN
10.0000 mL | Freq: Once | INTRAVENOUS | Status: AC | PRN
  Administered 2021-08-12: 10 mL via INTRAVENOUS

## 2021-08-18 ENCOUNTER — Encounter: Payer: Self-pay | Admitting: Genetic Counselor

## 2021-08-19 ENCOUNTER — Telehealth: Payer: Self-pay

## 2021-08-19 DIAGNOSIS — I447 Left bundle-branch block, unspecified: Secondary | ICD-10-CM

## 2021-08-19 DIAGNOSIS — I5022 Chronic systolic (congestive) heart failure: Secondary | ICD-10-CM

## 2021-08-27 ENCOUNTER — Encounter (HOSPITAL_BASED_OUTPATIENT_CLINIC_OR_DEPARTMENT_OTHER): Payer: Self-pay | Admitting: Cardiovascular Disease

## 2021-08-27 DIAGNOSIS — I5040 Unspecified combined systolic (congestive) and diastolic (congestive) heart failure: Secondary | ICD-10-CM

## 2021-08-31 ENCOUNTER — Other Ambulatory Visit: Payer: Self-pay

## 2021-08-31 MED ORDER — CARVEDILOL 25 MG PO TABS
ORAL_TABLET | Freq: Two times a day (BID) | ORAL | 6 refills | Status: DC
Start: 2021-08-31 — End: 2022-02-17
  Filled 2021-08-31: qty 60, 30d supply, fill #0
  Filled 2021-10-16: qty 60, 30d supply, fill #1
  Filled 2021-10-16: qty 60, 30d supply, fill #0

## 2021-08-31 MED ORDER — FUROSEMIDE 40 MG PO TABS
ORAL_TABLET | Freq: Every day | ORAL | 2 refills | Status: DC
Start: 2021-08-31 — End: 2022-02-17
  Filled 2021-08-31 – 2021-10-16 (×2): qty 30, 30d supply, fill #0
  Filled 2021-10-16: qty 30, 30d supply, fill #1

## 2021-08-31 MED ORDER — SPIRONOLACTONE 25 MG PO TABS
25.0000 mg | ORAL_TABLET | Freq: Every day | ORAL | 1 refills | Status: DC
  Filled 2021-08-31: qty 15, 15d supply, fill #0
  Filled 2021-10-16: qty 15, 15d supply, fill #1
  Filled 2021-10-16: qty 15, 15d supply, fill #0

## 2021-08-31 MED ORDER — SACUBITRIL-VALSARTAN 97-103 MG PO TABS
1.0000 | ORAL_TABLET | Freq: Two times a day (BID) | ORAL | 3 refills | Status: DC
  Filled 2021-08-31 – 2021-10-16 (×3): qty 60, 30d supply, fill #0

## 2021-08-31 MED ORDER — EMPAGLIFLOZIN 10 MG PO TABS
ORAL_TABLET | Freq: Every day | ORAL | 1 refills | Status: DC
  Filled 2021-08-31: qty 90, fill #0

## 2021-09-01 ENCOUNTER — Other Ambulatory Visit: Payer: Self-pay

## 2021-09-02 NOTE — Telephone Encounter (Signed)
Pt scheduled for BIV ICD  Work up complete

## 2021-10-16 ENCOUNTER — Other Ambulatory Visit: Payer: Self-pay

## 2021-10-17 ENCOUNTER — Other Ambulatory Visit: Payer: Self-pay

## 2021-10-20 ENCOUNTER — Other Ambulatory Visit: Payer: Self-pay

## 2021-10-20 ENCOUNTER — Other Ambulatory Visit (HOSPITAL_COMMUNITY): Payer: Self-pay

## 2021-10-20 DIAGNOSIS — I5022 Chronic systolic (congestive) heart failure: Secondary | ICD-10-CM

## 2021-10-20 DIAGNOSIS — I447 Left bundle-branch block, unspecified: Secondary | ICD-10-CM

## 2021-10-20 LAB — CBC WITH DIFFERENTIAL/PLATELET
Basophils Absolute: 0 10*3/uL (ref 0.0–0.2)
Basos: 0 %
EOS (ABSOLUTE): 0 10*3/uL (ref 0.0–0.4)
Eos: 1 %
Hematocrit: 33.4 % — ABNORMAL LOW (ref 34.0–46.6)
Hemoglobin: 10.9 g/dL — ABNORMAL LOW (ref 11.1–15.9)
Immature Grans (Abs): 0 10*3/uL (ref 0.0–0.1)
Immature Granulocytes: 0 %
Lymphocytes Absolute: 0.9 10*3/uL (ref 0.7–3.1)
Lymphs: 31 %
MCH: 29.5 pg (ref 26.6–33.0)
MCHC: 32.6 g/dL (ref 31.5–35.7)
MCV: 91 fL (ref 79–97)
Monocytes Absolute: 0.4 10*3/uL (ref 0.1–0.9)
Monocytes: 13 %
Neutrophils Absolute: 1.7 10*3/uL (ref 1.4–7.0)
Neutrophils: 55 %
Platelets: 238 10*3/uL (ref 150–450)
RBC: 3.69 x10E6/uL — ABNORMAL LOW (ref 3.77–5.28)
RDW: 13.7 % (ref 11.7–15.4)
WBC: 3 10*3/uL — ABNORMAL LOW (ref 3.4–10.8)

## 2021-10-20 LAB — BASIC METABOLIC PANEL
BUN/Creatinine Ratio: 8 — ABNORMAL LOW (ref 9–23)
BUN: 6 mg/dL (ref 6–24)
CO2: 23 mmol/L (ref 20–29)
Calcium: 8.7 mg/dL (ref 8.7–10.2)
Chloride: 103 mmol/L (ref 96–106)
Creatinine, Ser: 0.72 mg/dL (ref 0.57–1.00)
Glucose: 81 mg/dL (ref 70–99)
Potassium: 4.5 mmol/L (ref 3.5–5.2)
Sodium: 139 mmol/L (ref 134–144)
eGFR: 108 mL/min/{1.73_m2} (ref >59)

## 2021-10-21 ENCOUNTER — Telehealth: Payer: Self-pay | Admitting: *Deleted

## 2021-10-21 NOTE — Telephone Encounter (Signed)
Called about missed pre op lab appointment. Left voice mail to call back.

## 2021-11-06 NOTE — Pre-Procedure Instructions (Signed)
Instructed patient on the following items: Arrival time 0830 Nothing to eat or drink after midnight No meds AM of procedure Responsible person to drive you home and stay with you for 24 hrs Wash with special soap night before and morning of procedure  

## 2021-11-09 ENCOUNTER — Ambulatory Visit (HOSPITAL_COMMUNITY)
Admission: RE | Admit: 2021-11-09 | Discharge: 2021-11-09 | Disposition: A | Discharge status: Home / Self Care | Payer: Managed Care, Other (non HMO) | Attending: Cardiology | Admitting: Cardiology

## 2021-11-09 ENCOUNTER — Ambulatory Visit (HOSPITAL_COMMUNITY): Payer: Managed Care, Other (non HMO)

## 2021-11-09 ENCOUNTER — Ambulatory Visit (HOSPITAL_COMMUNITY)
Admission: RE | Disposition: A | Discharge status: Home / Self Care | Payer: Self-pay | Source: Home / Self Care | Attending: Cardiology

## 2021-11-09 ENCOUNTER — Other Ambulatory Visit: Payer: Self-pay

## 2021-11-09 DIAGNOSIS — I428 Other cardiomyopathies: Secondary | ICD-10-CM | POA: Diagnosis not present

## 2021-11-09 DIAGNOSIS — I447 Left bundle-branch block, unspecified: Secondary | ICD-10-CM | POA: Insufficient documentation

## 2021-11-09 DIAGNOSIS — I110 Hypertensive heart disease with heart failure: Secondary | ICD-10-CM | POA: Insufficient documentation

## 2021-11-09 DIAGNOSIS — I5022 Chronic systolic (congestive) heart failure: Secondary | ICD-10-CM | POA: Insufficient documentation

## 2021-11-09 DIAGNOSIS — Z87891 Personal history of nicotine dependence: Secondary | ICD-10-CM | POA: Diagnosis not present

## 2021-11-09 DIAGNOSIS — Z95810 Presence of automatic (implantable) cardiac defibrillator: Secondary | ICD-10-CM

## 2021-11-09 HISTORY — PX: BIV ICD INSERTION CRT-D: EP1195

## 2021-11-09 SURGERY — BIV ICD INSERTION CRT-D

## 2021-11-09 MED ORDER — LIDOCAINE HCL (PF) 1 % IJ SOLN
INTRAMUSCULAR | Status: DC | PRN
  Administered 2021-11-09: 60 mL
  Administered 2021-11-09: 20 mL

## 2021-11-09 MED ORDER — SODIUM CHLORIDE 0.9 % IV SOLN: INTRAVENOUS | Status: DC

## 2021-11-09 MED ORDER — CEFAZOLIN SODIUM-DEXTROSE 2-4 GM/100ML-% IV SOLN
2.0000 g | INTRAVENOUS | Status: AC
  Administered 2021-11-09: 2 g via INTRAVENOUS

## 2021-11-09 MED ORDER — POVIDONE-IODINE 10 % EX SWAB: 2.0000 "application " | Freq: Once | CUTANEOUS | Status: DC

## 2021-11-09 MED ORDER — LIDOCAINE HCL 1 % IJ SOLN
INTRAMUSCULAR | Status: AC
  Filled 2021-11-09: qty 20

## 2021-11-09 MED ORDER — CHLORHEXIDINE GLUCONATE 4 % EX LIQD: 4.0000 "application " | Freq: Once | CUTANEOUS | Status: DC

## 2021-11-09 MED ORDER — IOHEXOL 350 MG/ML SOLN
INTRAVENOUS | Status: DC | PRN
  Administered 2021-11-09: 5 mL

## 2021-11-09 MED ORDER — HEPARIN (PORCINE) IN NACL 1000-0.9 UT/500ML-% IV SOLN
INTRAVENOUS | Status: AC
  Filled 2021-11-09: qty 500

## 2021-11-09 MED ORDER — FENTANYL CITRATE (PF) 100 MCG/2ML IJ SOLN
INTRAMUSCULAR | Status: DC | PRN
  Administered 2021-11-09 (×2): 25 ug via INTRAVENOUS

## 2021-11-09 MED ORDER — LIDOCAINE HCL (PF) 1 % IJ SOLN
INTRAMUSCULAR | Status: AC
  Filled 2021-11-09: qty 60

## 2021-11-09 MED ORDER — CEFAZOLIN SODIUM-DEXTROSE 2-4 GM/100ML-% IV SOLN
INTRAVENOUS | Status: AC
  Filled 2021-11-09: qty 100

## 2021-11-09 MED ORDER — ACETAMINOPHEN 325 MG PO TABS
325.0000 mg | ORAL_TABLET | ORAL | Status: DC | PRN
  Administered 2021-11-09: 650 mg via ORAL
  Filled 2021-11-09: qty 2

## 2021-11-09 MED ORDER — ONDANSETRON HCL 4 MG/2ML IJ SOLN: 4.0000 mg | Freq: Four times a day (QID) | INTRAMUSCULAR | Status: DC | PRN

## 2021-11-09 MED ORDER — FENTANYL CITRATE (PF) 100 MCG/2ML IJ SOLN
INTRAMUSCULAR | Status: AC
  Filled 2021-11-09: qty 2

## 2021-11-09 MED ORDER — MIDAZOLAM HCL 5 MG/5ML IJ SOLN
INTRAMUSCULAR | Status: AC
  Filled 2021-11-09: qty 5

## 2021-11-09 MED ORDER — SODIUM CHLORIDE 0.9 % IV SOLN
INTRAVENOUS | Status: AC
  Filled 2021-11-09: qty 2

## 2021-11-09 MED ORDER — MIDAZOLAM HCL 5 MG/5ML IJ SOLN
INTRAMUSCULAR | Status: DC | PRN
  Administered 2021-11-09 (×2): 1 mg via INTRAVENOUS

## 2021-11-09 MED ORDER — SODIUM CHLORIDE 0.9 % IV SOLN
80.0000 mg | INTRAVENOUS | Status: AC
  Administered 2021-11-09: 80 mg

## 2021-11-09 SURGICAL SUPPLY — 15 items
CABLE SURGICAL S-101-97-12 (CABLE) ×2 IMPLANT
CATH ATTAIN COM SURV 6250V-EH (CATHETERS) ×1 IMPLANT
ICD GALLANT HFCRTD CDHFA500Q (ICD Generator) ×1 IMPLANT
LEAD DURATA 7122Q-65CM (Lead) ×1 IMPLANT
LEAD QUARTET 1458QL-86 (Lead) IMPLANT
LEAD TENDRIL MRI 52CM LPA1200M (Lead) ×1 IMPLANT
PAD DEFIB RADIO PHYSIO CONN (PAD) ×2 IMPLANT
QUARTET 1458QL-86 (Lead) ×2 IMPLANT
SHEATH 7FR PRELUDE SNAP 13 (SHEATH) ×1 IMPLANT
SHEATH 8FR PRELUDE SNAP 13 (SHEATH) ×1 IMPLANT
SHEATH 9.5FR PRELUDE SNAP 13 (SHEATH) ×1 IMPLANT
SLITTER 6232ADJ (MISCELLANEOUS) ×1 IMPLANT
TRAY PACEMAKER INSERTION (PACKS) ×2 IMPLANT
WIRE ACUITY WHISPER EDS 4648 (WIRE) ×1 IMPLANT
WIRE HI TORQ VERSACORE-J 145CM (WIRE) ×1 IMPLANT

## 2021-11-09 NOTE — Discharge Instructions (Signed)
° ° °  Supplemental Discharge Instructions for  Pacemaker/Defibrillator Patients  Tomorrow, 11/10/21, send in a device transmission  Activity No heavy lifting or vigorous activity with your left/right arm for 6 to 8 weeks.  Do not raise your left/right arm above your head for one week.  Gradually raise your affected arm as drawn below.             11/14/21                     11/15/21                       11/16/21                    11/17/21 __  NO DRIVING for   1 week  ; you may begin driving on   [id number] .  WOUND CARE Keep the wound area clean and dry.  Do not get this area wet , no showers for one week; you may shower on   11/17/21  . Tomorrow, 11/10/21, remove the arm sling Tomorrow, 11/10/21 remove the LARGE outer plastic bandage.  Underneath the plastic bandage there are steri strips (paper tapes), DO NOT remove these. The tape/steri-strips on your wound will fall off; do not pull them off.  No bandage is needed on the site.  DO  NOT apply any creams, oils, or ointments to the wound area. If you notice any drainage or discharge from the wound, any swelling or bruising at the site, or you develop a fever > 101? F after you are discharged home, call the office at once.  Special Instructions You are still able to use cellular telephones; use the ear opposite the side where you have your pacemaker/defibrillator.  Avoid carrying your cellular phone near your device. When traveling through airports, show security personnel your identification card to avoid being screened in the metal detectors.  Ask the security personnel to use the hand wand. Avoid arc welding equipment, MRI testing (magnetic resonance imaging), TENS units (transcutaneous nerve stimulators).  Call the office for questions about other devices. Avoid electrical appliances that are in poor condition or are not properly grounded. Microwave ovens are safe to be near or to operate.  Additional information for defibrillator patients should  your device go off: If your device goes off ONCE and you feel fine afterward, notify the device clinic nurses. If your device goes off ONCE and you do not feel well afterward, call 911. If your device goes off TWICE, call 911. If your device goes off THREE times in one day, call 911.  DO NOT DRIVE YOURSELF OR A FAMILY MEMBER WITH A DEFIBRILLATOR TO THE HOSPITAL--CALL 911.

## 2021-11-09 NOTE — H&P (Signed)
Electrophysiology Office Note:     Date:  11/09/2021    ID:  Isabella Sanders, DOB 22-Aug-1981, MRN [id number]   PCP:  Marcine Matar, MD    [facility] Cardiologist:  Reatha Harps, MD  [facility] Electrophysiologist:  Lanier Prude, MD    Referring MD: Sande Rives, *    Chief Complaint: chronic systolic HF/NICM   History of Present Illness:     Isabella Sanders is a 41 y.o. female who presents for an evaluation of NICM at the request of Dr Flora Lipps. Their medical history includes LBBB, HTN, chronic systolic HF, tobacco abuse.   She last saw Dr Flora Lipps on 03/05/2021. At that appointment she had stopped smoking. Given a persistently decreased EF despite GDMT and a LBBB she is referred for consideration of CRT-D.   She tells me that her sister died suddenly at age 53 while at [facility].  An autopsy was performed and she thinks they told her that there was a hypertrophic cardiomyopathy present.  No other defibrillator is present in the family.  No drowning.  No other premature sudden cardiac deaths noted in the family history.         Past Medical History:  Diagnosis Date   CHF (congestive heart failure) (HCC)     Hypertension     Tobacco abuse             Past Surgical History:  Procedure Laterality Date   CARDIAC CATHETERIZATION       CESAREAN SECTION       CHOLECYSTECTOMY       RIGHT/LEFT HEART CATH AND CORONARY ANGIOGRAPHY N/A 08/22/2020    Procedure: RIGHT/LEFT HEART CATH AND CORONARY ANGIOGRAPHY;  Surgeon: Corky Crafts, MD;  Location: [facility] INVASIVE CV LAB;  Service: Cardiovascular;  Laterality: N/A;   TUBAL LIGATION          Current Medications: Active Medications      Current Meds  Medication Sig   carvedilol (COREG) 25 MG tablet TAKE 1 TABLET (25 MG TOTAL) BY MOUTH 2 (TWO) TIMES DAILY WITH A MEAL.   empagliflozin (JARDIANCE) 10 MG TABS tablet TAKE 1 TABLET (10 MG TOTAL) BY MOUTH DAILY BEFORE BREAKFAST.   ferrous sulfate 325 (65 FE) MG  tablet TAKE 1 TABLET (325 MG TOTAL) BY MOUTH DAILY WITH BREAKFAST.   furosemide (LASIX) 40 MG tablet TAKE 1 TABLET (40 MG TOTAL) BY MOUTH DAILY.   potassium chloride SA (KLOR-CON) 20 MEQ tablet TAKE 2 TABLETS (40 MEQ TOTAL) BY MOUTH DAILY. TAKE WITH YOUR LASIX.   sacubitril-valsartan (ENTRESTO) 97-103 MG TAKE 1 TABLET BY MOUTH 2 (TWO) TIMES DAILY.   spironolactone (ALDACTONE) 25 MG tablet TAKE 0.5 TABLETS (12.5 MG TOTAL) BY MOUTH DAILY.        Allergies:   Patient has no known allergies.    Social History         Socioeconomic History   Marital status: Single      Spouse name: Not on file   Number of children: 2   Years of education: Not on file   Highest education level: 12th grade  Occupational History   Occupation: Restaurant  Tobacco Use   Smoking status: Former      Years: 10.00      Types: Cigarettes   Smokeless tobacco: Never  Vaping Use   Vaping Use: Never used  Substance and Sexual Activity   Alcohol use: Never   Drug use: Not Currently   Sexual activity:  Not on file  Other Topics Concern   Not on file  Social History Narrative   Not on file    Social Determinants of Health    Financial Resource Strain: Not on file  Food Insecurity: Not on file  Transportation Needs: Not on file  Physical Activity: Not on file  Stress: Not on file  Social Connections: Not on file      Family History: The patient's family history includes Heart attack in her sister; Sudden Cardiac Death (age of onset: 74) in her sister.   ROS:   Please see the history of present illness.    All other systems reviewed and are negative.   EKGs/Labs/Other Studies Reviewed:     The following studies were reviewed today:   02/23/2021 Echo personally reviewed LV function severely decreased, 30% Dyssynchrony noted RV normal Moderate MR   08/22/2020 LHC No significant CAD       EKG:  The ekg ordered today demonstrates sinus rhythm with a bundle branch block.  QRS duration 170 ms.    Recent Labs: 08/20/2020: B Natriuretic Peptide 2,726.1; TSH 2.161 08/21/2020: Magnesium 1.9 08/26/2020: ALT 20 03/05/2021: BUN 9; Creatinine, Ser 0.77; Hemoglobin 8.2; Platelets 297; Potassium 4.1; Sodium 137  Recent Lipid Panel Labs (Brief)          Component Value Date/Time    CHOL 122 08/21/2020 0209    TRIG 40 08/21/2020 0209    HDL 32 (L) 08/21/2020 0209    CHOLHDL 3.8 08/21/2020 0209    VLDL 8 08/21/2020 0209    LDLCALC 82 08/21/2020 0209        Physical Exam:     VS:  BP (!) 146/80    Pulse (!) 54    Ht 5\' 5"  (1.651 m)    Wt 191 lb (86.6 kg)    SpO2 99%    BMI 31.78 kg/m         Wt Readings from Last 3 Encounters:  05/08/21 191 lb (86.6 kg)  03/05/21 192 lb 12.8 oz (87.5 kg)  12/04/20 182 lb (82.6 kg)      GEN: Well nourished, well developed in no acute distress.  Obese HEENT: Normal NECK: No JVD; No carotid bruits LYMPHATICS: No lymphadenopathy CARDIAC: RRR, no murmurs, rubs, gallops RESPIRATORY:  Clear to auscultation without rales, wheezing or rhonchi  ABDOMEN: Soft, non-tender, non-distended MUSCULOSKELETAL:  No edema; No deformity  SKIN: Warm and dry NEUROLOGIC:  Alert and oriented x 3 PSYCHIATRIC:  Normal affect    ASSESSMENT:     1. Chronic systolic heart failure (HCC)   2. NICM (nonischemic cardiomyopathy) (HCC)   3. LBBB (left bundle branch block)   4. Primary hypertension     PLAN:     In order of problems listed above:   1. Chronic systolic heart failure (HCC) NYHA class III.  Low bundle-branch block.  Warm and euvolemic today.  Continue Coreg, digoxin, Jardiance, Lasix, Entresto, spironolactone.   I think she is a good candidate for CRT-D therapy given her left bundle branch block and nonischemic etiology.  I discussed the CRT-D implant in detail with the patient and her family who is with her today.  I discussed the risk, recovery and efficacy.   The patient has an non-ischemic CM (EF 30%), NYHA Class III CHF, and LBBB.  She is referred  by Dr 12/06/20 for risk stratification of sudden death and consideration of ICD implantation.  At this time, she meets criteria for ICD implantation for primary prevention  of sudden death.  I have had a thorough discussion with the patient reviewing options.  The patient and their family (if available) have had opportunities to ask questions and have them answered. The patient and I have decided together through a shared decision making process to implant CRT-D at this time.   Risks, benefits, alternatives to ICD implantation were discussed in detail with the patient today. The patient understands that the risks include but are not limited to bleeding, infection, pneumothorax, perforation, tamponade, vascular damage, renal failure, MI, stroke, death, inappropriate shocks, and lead dislodgement and wishes to proceed.  We will therefore schedule device implantation at the next available time.    Before we implant the CRT-D, we will plan to have a cardiac MRI to further characterize the myocardium.  I will also refer to genetics given her strong family history of sudden cardiac death.   We will [facility] for [facility] device   2. NICM (nonischemic cardiomyopathy) (HCC) See #1   3. LBBB (left bundle branch block) See #1     ---------------------------  I have seen, examined the patient, and reviewed the above assessment and plan.    Plan for CRT-D today.   Lanier Prude, MD 11/09/2021 11:35 AM

## 2021-11-10 ENCOUNTER — Encounter (HOSPITAL_COMMUNITY): Payer: Self-pay | Admitting: Cardiology

## 2021-11-10 MED FILL — Heparin Sod (Porcine)-NaCl IV Soln 1000 Unit/500ML-0.9%: INTRAVENOUS | Qty: 500 | Status: AC

## 2021-11-11 ENCOUNTER — Other Ambulatory Visit: Payer: Self-pay

## 2021-11-11 ENCOUNTER — Telehealth: Payer: Self-pay

## 2021-11-11 DIAGNOSIS — Z95810 Presence of automatic (implantable) cardiac defibrillator: Secondary | ICD-10-CM

## 2021-11-11 NOTE — Telephone Encounter (Signed)
Merlin alert- LV threshold elevated, device in high output Patient implanted 11/09/21.    Reviewed implant notes, no indication of what LV thresholds were at implant, only that excellent pacing thresholds were noted.    Spoke with pt, requested she send manual transmission.  She will send transmission via app on phone.  Advised one I receive the transmission and review it, I will call her back.

## 2021-11-12 NOTE — Telephone Encounter (Signed)
Called patient and ordered a chest xray. Sent mychart message and went over instructions. Patient verbalized understanding and will await scheduling for device clinic.

## 2021-11-12 NOTE — Telephone Encounter (Signed)
Follow-up transmission received.  LV lead continues to test high for threshold and remains in high output.  Routing to MD for further recommendation.

## 2021-11-13 ENCOUNTER — Ambulatory Visit
Admission: RE | Admit: 2021-11-13 | Discharge: 2021-11-13 | Disposition: A | Discharge status: Home / Self Care | Payer: Managed Care, Other (non HMO) | Source: Ambulatory Visit | Attending: Cardiology | Admitting: Cardiology

## 2021-11-13 DIAGNOSIS — Z95810 Presence of automatic (implantable) cardiac defibrillator: Secondary | ICD-10-CM

## 2021-11-13 NOTE — Telephone Encounter (Signed)
Unsuccessful telephone encounter to patient to schedule device clinic appointment for Monday 11/16/21 per Dr. Lalla Brothers to check LV threshold. Patient previously scheduled for post implant wound check 11/19/21 however needs to be seen sooner to assess lead integrity/placement. CXR ordered. [employer] compliant VM message left requesting call back to [phone].

## 2021-11-16 ENCOUNTER — Ambulatory Visit (INDEPENDENT_AMBULATORY_CARE_PROVIDER_SITE_OTHER): Payer: Managed Care, Other (non HMO)

## 2021-11-16 ENCOUNTER — Other Ambulatory Visit: Payer: Self-pay

## 2021-11-16 DIAGNOSIS — I5021 Acute systolic (congestive) heart failure: Secondary | ICD-10-CM | POA: Diagnosis not present

## 2021-11-16 LAB — CUP PACEART INCLINIC DEVICE CHECK
Battery Remaining Longevity: 48 mo
Brady Statistic RA Percent Paced: 12 %
Brady Statistic RV Percent Paced: 99.12 %
Date Time Interrogation Session: 20230206094843
HighPow Impedance: 48.375
Implantable Lead Implant Date: 20230130
Implantable Lead Implant Date: 20230130
Implantable Lead Implant Date: 20230130
Implantable Lead Location: 753858
Implantable Lead Location: 753859
Implantable Lead Location: 753860
Implantable Pulse Generator Implant Date: 20230130
Lead Channel Impedance Value: 287.5 Ohm
Lead Channel Impedance Value: 412.5 Ohm
Lead Channel Impedance Value: 500 Ohm
Lead Channel Pacing Threshold Amplitude: 0.5 V
Lead Channel Pacing Threshold Amplitude: 0.5 V
Lead Channel Pacing Threshold Amplitude: 2.875 V
Lead Channel Pacing Threshold Pulse Width: 0.5 ms
Lead Channel Pacing Threshold Pulse Width: 0.5 ms
Lead Channel Pacing Threshold Pulse Width: 0.8 ms
Lead Channel Sensing Intrinsic Amplitude: 12 mV
Lead Channel Sensing Intrinsic Amplitude: 4.4 mV
Lead Channel Setting Pacing Amplitude: 3.25 V
Lead Channel Setting Pacing Amplitude: 3.5 V
Lead Channel Setting Pacing Amplitude: 3.5 V
Lead Channel Setting Pacing Pulse Width: 0.5 ms
Lead Channel Setting Pacing Pulse Width: 0.8 ms
Lead Channel Setting Sensing Sensitivity: 0.5 mV
Pulse Gen Serial Number: 111057717

## 2021-11-16 NOTE — Progress Notes (Signed)
CRT-D device check in office per Dr. Quentin Ore due to elevated LV threshold results on Cap Confirm. RA/RV Thresholds and sensing consistent with previous device measurements. Lead impedance trends stable over time.  LV threshold tested through [facility].  Noting best results at M3to RVC.  Reviewed results with Renee U. in office.  Turned Cap confirm to monitor only and reprogrammed from M4 to RVC => M3 to RVC with output set at 1/2 volt higher than tested = 3.25@ 0.8 ms.  Note, patient did have clear PNS when programmed with any configuration involving M2.   No mode switch episodes recorded. No ventricular arrhythmia episodes recorded. Patient bi-ventricularly pacing >99% of the time. Device programmed with appropriate safety margins- RA/RV at 3.5V for extra safety until 3 month check.  Patient enrolled in remote follow up. Patient is scheduled for wound check on 11/19/21, site observed today, mild edema present, soft, mildly tender to touch. No warmth, redness or drainage visible around steri-strips.  Patient educated on close monitoring until wound check.

## 2021-11-19 ENCOUNTER — Ambulatory Visit (INDEPENDENT_AMBULATORY_CARE_PROVIDER_SITE_OTHER): Payer: Managed Care, Other (non HMO)

## 2021-11-19 ENCOUNTER — Other Ambulatory Visit: Payer: Self-pay

## 2021-11-19 DIAGNOSIS — I5021 Acute systolic (congestive) heart failure: Secondary | ICD-10-CM

## 2021-11-19 LAB — CUP PACEART INCLINIC DEVICE CHECK
Date Time Interrogation Session: 20230209110658
Implantable Lead Implant Date: 20230130
Implantable Lead Implant Date: 20230130
Implantable Lead Implant Date: 20230130
Implantable Lead Location: 753858
Implantable Lead Location: 753859
Implantable Lead Location: 753860
Implantable Pulse Generator Implant Date: 20230130
Pulse Gen Serial Number: 111057717

## 2021-11-19 MED ORDER — CEPHALEXIN 500 MG PO CAPS
500.0000 mg | ORAL_CAPSULE | Freq: Three times a day (TID) | ORAL | 0 refills | Status: DC
  Filled 2021-11-19: qty 21, 7d supply, fill #0

## 2021-11-19 NOTE — Patient Instructions (Signed)
° °  After Your ICD (Implantable Cardiac Defibrillator)    Monitor your defibrillator site for redness, swelling, and drainage. Call the device clinic at [phone] if you experience these symptoms or fever/chills.  No showering/Do not get steri-strips wet until next appointment on 11/24/21 at 10:00 . No lotions, ointments, powders or spray on incision site.    Do not lift, push or pull greater than 10 pounds with the affected arm until 6 weeks after your procedure. There are no other restrictions in arm movement after your wound check appointment.  Call [employer] regarding MRI compatibility   Your ICD is designed to protect you from life threatening heart rhythms. Because of this, you may receive a shock.   1 shock with no symptoms:  Call the office during business hours. 1 shock with symptoms (chest pain, chest pressure, dizziness, lightheadedness, shortness of breath, overall feeling unwell):  Call 911. If you experience 2 or more shocks in 24 hours:  Call 911. If you receive a shock, you should not drive.  [place] DMV - no driving for 6 months if you receive appropriate therapy from your ICD.   ICD Alerts:  Some alerts are vibratory and others beep. These are NOT emergencies. Please call our office to let us know. If this occurs at night or on weekends, it can wait until the next business day. Send a remote transmission.  If your device is capable of reading fluid status (for heart failure), you will be offered monthly monitoring to review this with you.   Remote monitoring is used to monitor your ICD from home. This monitoring is scheduled every 91 days by our office. It allows [place] to keep an eye on the functioning of your device to ensure it is working properly. You will routinely see your Electrophysiologist annually (more often if necessary).

## 2021-11-19 NOTE — Progress Notes (Signed)
Wound check appointment. Steri-strips removed. Right and left lateral corners of incision un-approximated, serosanguineous drainage noted from the right lateral incision site, patient c/o of tenderness and pain if incision site. Dr. Lalla Brothers contacted orders given for keflex 500mg  x 7 days. Steri-strips applied to right and left lateral corners, patient educated not to get incision site wet unit wound re-check on 11/25/11. Full device interrogation on 11/16/21 due to elevated LV thresholds. M3 to RVC noted at 4.0V@1 .61ms on today's device interrogation. Distal tip to RV coil threshold 1.25V@0 .71ms, LV vector changed to Distal tip1 to RV coil, LV output programmed 2.5V@0 .39ms. RV autocapture turned on.

## 2021-11-24 ENCOUNTER — Ambulatory Visit: Payer: Managed Care, Other (non HMO)

## 2021-11-24 ENCOUNTER — Other Ambulatory Visit: Payer: Self-pay

## 2021-11-24 DIAGNOSIS — I5040 Unspecified combined systolic (congestive) and diastolic (congestive) heart failure: Secondary | ICD-10-CM

## 2021-11-24 MED ORDER — CEPHALEXIN 500 MG PO CAPS
500.0000 mg | ORAL_CAPSULE | Freq: Three times a day (TID) | ORAL | 0 refills | Status: DC
  Filled 2021-11-24: qty 15, 5d supply, fill #0

## 2021-11-24 NOTE — Progress Notes (Signed)
Patient seen in device clinic for wound recheck. Education provided to patient about wound care. Dr Quentin Ore in to see patient and stitch removed from incision. Purulent drainage noted at site. Verbal order obtained to prescribed Kelfex 500mg  TID x5 days per Dr. Quentin Ore. Script sent to pharmacy, patient aware. Patient advised to call if bleeding, increased drainage, redness or swelling noted. Dr. Quentin Ore advised patient to leave site open and keep gauze over incision while drainage. Dressing applied and patient educated how to change dressing w/ verbal understanding. Repeat wound check in 2 weeks per Dr. Quentin Ore. 12/09/21.

## 2021-11-24 NOTE — Patient Instructions (Signed)
Please continue to monitor for increased redness, swelling and drainage. If you see any increase, call the device clinic. Continue to take antibiotics daily and do not miss or skip doses.   Device Clinic [phone]

## 2021-11-25 ENCOUNTER — Other Ambulatory Visit: Payer: Self-pay

## 2021-12-09 ENCOUNTER — Ambulatory Visit (INDEPENDENT_AMBULATORY_CARE_PROVIDER_SITE_OTHER): Payer: Managed Care, Other (non HMO)

## 2021-12-09 ENCOUNTER — Other Ambulatory Visit: Payer: Self-pay

## 2021-12-09 DIAGNOSIS — I5021 Acute systolic (congestive) heart failure: Secondary | ICD-10-CM

## 2021-12-09 NOTE — Progress Notes (Signed)
Patient seen in device clinic today for wound recheck. Site appears closed, no drainage or redness noted. Patient reports she feels like it has healed well and voices no concerns. Wound care education reviewed with patient, patient voiced understanding.  ? ?Permission from patient from requested to attach picture in medical record and patient agreeable.  ? ? ?

## 2021-12-09 NOTE — Patient Instructions (Signed)
   Monitor your pacemaker site for redness, swelling, and drainage. Call the device clinic at [phone] if you experience these symptoms or fever/chills.  

## 2022-02-09 ENCOUNTER — Ambulatory Visit (INDEPENDENT_AMBULATORY_CARE_PROVIDER_SITE_OTHER): Payer: Managed Care, Other (non HMO)

## 2022-02-09 DIAGNOSIS — I428 Other cardiomyopathies: Secondary | ICD-10-CM

## 2022-02-09 LAB — CUP PACEART REMOTE DEVICE CHECK
Battery Remaining Longevity: 74 mo
Battery Remaining Percentage: 94 %
Battery Voltage: 3.02 V
Brady Statistic AP VP Percent: 6.4 %
Brady Statistic AP VS Percent: 1 %
Brady Statistic AS VP Percent: 93 %
Brady Statistic AS VS Percent: 1 %
Brady Statistic RA Percent Paced: 6.4 %
Date Time Interrogation Session: 20230502030232
HighPow Impedance: 55 Ohm
Implantable Lead Implant Date: 20230130
Implantable Lead Implant Date: 20230130
Implantable Lead Implant Date: 20230130
Implantable Lead Location: 753858
Implantable Lead Location: 753859
Implantable Lead Location: 753860
Implantable Pulse Generator Implant Date: 20230130
Lead Channel Impedance Value: 460 Ohm
Lead Channel Impedance Value: 500 Ohm
Lead Channel Impedance Value: 500 Ohm
Lead Channel Pacing Threshold Amplitude: 0.5 V
Lead Channel Pacing Threshold Amplitude: 0.75 V
Lead Channel Pacing Threshold Amplitude: 0.875 V
Lead Channel Pacing Threshold Pulse Width: 0.5 ms
Lead Channel Pacing Threshold Pulse Width: 0.5 ms
Lead Channel Pacing Threshold Pulse Width: 0.8 ms
Lead Channel Sensing Intrinsic Amplitude: 12 mV
Lead Channel Sensing Intrinsic Amplitude: 2.4 mV
Lead Channel Setting Pacing Amplitude: 1.75 V
Lead Channel Setting Pacing Amplitude: 2.5 V
Lead Channel Setting Pacing Amplitude: 3.5 V
Lead Channel Setting Pacing Pulse Width: 0.5 ms
Lead Channel Setting Pacing Pulse Width: 0.8 ms
Lead Channel Setting Sensing Sensitivity: 0.5 mV
Pulse Gen Serial Number: 111057717

## 2022-02-16 NOTE — Progress Notes (Deleted)
?Electrophysiology Office Follow up Visit Note:   ? ?Date:  02/16/2022  ? ?ID:  Isabella Sanders, DOB 07-Nov-1980, MRN [id number] ? ?PCP:  Marcine Matar, MD  ?[facility] Cardiologist:  Reatha Harps, MD  ?[facility] Electrophysiologist:  Lanier Prude, MD  ? ? ?Interval History:   ? ?Isabella Sanders is a 41 y.o. female who presents for a follow up visit. She had a CRT-D implanted 11/09/2021. She has done well since implant. Device interrogations have shown stable device function.  ? ? ? ?  ? ?Past Medical History:  ?Diagnosis Date  ? CHF (congestive heart failure) (HCC)   ? Hypertension   ? Tobacco abuse   ? ? ?Past Surgical History:  ?Procedure Laterality Date  ? BIV ICD INSERTION CRT-D N/A 11/09/2021  ? Procedure: BIV ICD INSERTION CRT-D;  Surgeon: Lanier Prude, MD;  Location: [facility] INVASIVE CV LAB;  Service: Cardiovascular;  Laterality: N/A;  ? CARDIAC CATHETERIZATION    ? CESAREAN SECTION    ? CHOLECYSTECTOMY    ? RIGHT/LEFT HEART CATH AND CORONARY ANGIOGRAPHY N/A 08/22/2020  ? Procedure: RIGHT/LEFT HEART CATH AND CORONARY ANGIOGRAPHY;  Surgeon: Corky Crafts, MD;  Location: [facility] INVASIVE CV LAB;  Service: Cardiovascular;  Laterality: N/A;  ? TUBAL LIGATION    ? ? ?Current Medications: ?No outpatient medications have been marked as taking for the 02/17/22 encounter (Appointment) with Lanier Prude, MD.  ?  ? ?Allergies:   Patient has no known allergies.  ? ?Social History  ? ?Socioeconomic History  ? Marital status: Single  ?  Spouse name: Not on file  ? Number of children: 2  ? Years of education: Not on file  ? Highest education level: 12th grade  ?Occupational History  ? Occupation: Restaurant  ?Tobacco Use  ? Smoking status: Former  ?  Years: 10.00  ?  Types: Cigarettes  ? Smokeless tobacco: Never  ?Vaping Use  ? Vaping Use: Never used  ?Substance and Sexual Activity  ? Alcohol use: Never  ? Drug use: Not Currently  ? Sexual activity: Not on file  ?Other Topics Concern  ? Not on  file  ?Social History Narrative  ? Not on file  ? ?Social Determinants of Health  ? ?Financial Resource Strain: Not on file  ?Food Insecurity: Not on file  ?Transportation Needs: Not on file  ?Physical Activity: Not on file  ?Stress: Not on file  ?Social Connections: Not on file  ?  ? ?Family History: ?The patient's family history includes Heart attack in her sister; Sudden Cardiac Death (age of onset: 51) in her sister. ? ?ROS:   ?Please see the history of present illness.    ?All other systems reviewed and are negative. ? ?EKGs/Labs/Other Studies Reviewed:   ? ?The following studies were reviewed today: ? ?02/16/2022 device interrogation personally reviewed ?*** ? ?EKG:  The ekg ordered today demonstrates *** ? ?Recent Labs: ?10/20/2021: BUN 6; Creatinine, Ser 0.72; Hemoglobin 10.9; Platelets 238; Potassium 4.5; Sodium 139  ?Recent Lipid Panel ?   ?Component Value Date/Time  ? CHOL 122 08/21/2020 0209  ? TRIG 40 08/21/2020 0209  ? HDL 32 (L) 08/21/2020 0209  ? CHOLHDL 3.8 08/21/2020 0209  ? VLDL 8 08/21/2020 0209  ? LDLCALC 82 08/21/2020 0209  ? ? ?Physical Exam:   ? ?VS:  There were no vitals taken for this visit.   ? ?Wt Readings from Last 3 Encounters:  ?11/09/21 201 lb (91.2 kg)  ?07/10/21 192  lb 9.6 oz (87.4 kg)  ?05/08/21 191 lb (86.6 kg)  ?  ? ?GEN: *** Well nourished, well developed in no acute distress ?HEENT: Normal ?NECK: No JVD; No carotid bruits ?LYMPHATICS: No lymphadenopathy ?CARDIAC: ***RRR, no murmurs, rubs, gallops ?RESPIRATORY:  Clear to auscultation without rales, wheezing or rhonchi  ?ABDOMEN: Soft, non-tender, non-distended ?MUSCULOSKELETAL:  No edema; No deformity  ?SKIN: Warm and dry ?NEUROLOGIC:  Alert and oriented x 3 ?PSYCHIATRIC:  Normal affect  ? ? ? ?  ? ?ASSESSMENT:   ? ?1. Combined systolic and diastolic congestive heart failure, unspecified HF chronicity (HCC)   ?2. LBBB (left bundle branch block)   ?3. Cardiac resynchronization therapy defibrillator (CRT-D) in place   ? ?PLAN:    ? ?In order of problems listed above: ? ? ? ? ? ? ? ? ? ? ?Total time spent with patient today *** minutes. This includes reviewing records, evaluating the patient and coordinating care.  ? ?Medication Adjustments/Labs and Tests Ordered: ?Current medicines are reviewed at length with the patient today.  Concerns regarding medicines are outlined above.  ?No orders of the defined types were placed in this encounter. ? ?No orders of the defined types were placed in this encounter. ? ? ? ?Signed, ?Steffanie Dunn, MD, [facility], [facility] ?02/16/2022 9:59 PM    ?Electrophysiology ?[facility] ?

## 2022-02-17 ENCOUNTER — Other Ambulatory Visit (HOSPITAL_COMMUNITY): Payer: Self-pay

## 2022-02-17 ENCOUNTER — Ambulatory Visit (INDEPENDENT_AMBULATORY_CARE_PROVIDER_SITE_OTHER): Payer: Commercial Managed Care - HMO | Admitting: Cardiology

## 2022-02-17 ENCOUNTER — Encounter: Payer: Self-pay | Admitting: Cardiology

## 2022-02-17 VITALS — BP 186/102 | HR 70 | Ht 65.0 in | Wt 202.6 lb

## 2022-02-17 DIAGNOSIS — I447 Left bundle-branch block, unspecified: Secondary | ICD-10-CM

## 2022-02-17 DIAGNOSIS — I5040 Unspecified combined systolic (congestive) and diastolic (congestive) heart failure: Secondary | ICD-10-CM

## 2022-02-17 DIAGNOSIS — Z95810 Presence of automatic (implantable) cardiac defibrillator: Secondary | ICD-10-CM

## 2022-02-17 MED ORDER — FUROSEMIDE 40 MG PO TABS
40.0000 mg | ORAL_TABLET | Freq: Every day | ORAL | 6 refills | Status: DC | PRN
  Filled 2022-02-17: qty 30, 30d supply, fill #0

## 2022-02-17 MED ORDER — SPIRONOLACTONE 25 MG PO TABS
25.0000 mg | ORAL_TABLET | Freq: Every day | ORAL | 1 refills | Status: DC
  Filled 2022-02-17: qty 15, 15d supply, fill #0

## 2022-02-17 MED ORDER — SACUBITRIL-VALSARTAN 97-103 MG PO TABS
1.0000 | ORAL_TABLET | Freq: Two times a day (BID) | ORAL | 11 refills | Status: DC
  Filled 2022-02-17: qty 60, 30d supply, fill #0

## 2022-02-17 MED ORDER — CARVEDILOL 25 MG PO TABS
ORAL_TABLET | Freq: Two times a day (BID) | ORAL | 11 refills | Status: DC
  Filled 2022-02-17: qty 60, fill #0

## 2022-02-17 NOTE — Patient Instructions (Addendum)
Medication Instructions:  ?Lasix 40 mg daily as needed for a 3 pound weight gain. ?Your physician recommends that you continue on your current medications as directed. Please refer to the Current Medication list given to you today. ?*If you need a refill on your cardiac medications before your next appointment, please call your pharmacy* ? ?Lab Work: ?None. ?If you have labs (blood work) drawn today and your tests are completely normal, you will receive your results only by: ?MyChart Message (if you have MyChart) OR ?A paper copy in the mail ?If you have any lab test that is abnormal or we need to change your treatment, we will call you to review the results. ? ?Testing/Procedures: ?Your physician has requested that you have an echocardiogram. Echocardiography is a painless test that uses sound waves to create images of your heart. It provides your doctor with information about the size and shape of your heart and how well your heart?s chambers and valves are working. This procedure takes approximately one hour. There are no restrictions for this procedure.  ? ?Follow-Up: ?At [facility], you and your health needs are our priority.  As part of our continuing mission to provide you with exceptional heart care, we have created designated Provider Care Teams.  These Care Teams include your primary Cardiologist (physician) and Advanced Practice Providers (APPs -  Physician Assistants and Nurse Practitioners) who all work together to provide you with the care you need, when you need it. ? ?Your physician wants you to follow-up in: 12 months with Steffanie Dunn, MD or one of the following Advanced Practice Providers on your designated Care Team:   ? ?Francis Dowse, PA-C ?Casimiro Needle "Mardelle Matte" [place], PA-C ?  You will receive a reminder letter in the mail two months in advance. If you don't receive a letter, please call our office to schedule the follow-up appointment. ? ?We recommend signing up for the patient portal called  "MyChart".  Sign up information is provided on this After Visit Summary.  MyChart is used to connect with patients for Virtual Visits (Telemedicine).  Patients are able to view lab/test results, encounter notes, upcoming appointments, etc.  Non-urgent messages can be sent to your provider as well.   ?To learn more about what you can do with MyChart, go to [email or web address].   ? ?Any Other Special Instructions Will Be Listed Below (If Applicable). ? ? ? ? ?  ? ? ?

## 2022-02-17 NOTE — Progress Notes (Signed)
?Electrophysiology Office Follow up Visit Note:   ? ?Date:  02/17/2022  ? ?ID:  Isabella Sanders, DOB July 16, 1981, MRN [id number] ? ?PCP:  Marcine Matar, MD  ?[facility] Cardiologist:  Reatha Harps, MD  ?[facility] Electrophysiologist:  Lanier Prude, MD  ? ? ?Interval History:   ? ?Isabella Sanders is a 41 y.o. female who presents for a follow up visit. She had a CRT-D implanted 11/09/2021. She has done well since implant. Device interrogations have shown stable device function.  ? ?Today, she denies any fluid retention in her legs. She is able to lie flat at night with no breathing issues. ? ?Lately she has not been monitoring her weight consistently. ? ?For a time she has been unable to obtain refills of most of her medications. She confirms she has been able to take Coreg and potassium. ? ?She denies any palpitations, chest pain, shortness of breath, or peripheral edema. No lightheadedness, headaches, syncope, orthopnea, or PND. ? ? ?  ? ?Past Medical History:  ?Diagnosis Date  ? CHF (congestive heart failure) (HCC)   ? Hypertension   ? Tobacco abuse   ? ? ?Past Surgical History:  ?Procedure Laterality Date  ? BIV ICD INSERTION CRT-D N/A 11/09/2021  ? Procedure: BIV ICD INSERTION CRT-D;  Surgeon: Lanier Prude, MD;  Location: [facility] INVASIVE CV LAB;  Service: Cardiovascular;  Laterality: N/A;  ? CARDIAC CATHETERIZATION    ? CESAREAN SECTION    ? CHOLECYSTECTOMY    ? RIGHT/LEFT HEART CATH AND CORONARY ANGIOGRAPHY N/A 08/22/2020  ? Procedure: RIGHT/LEFT HEART CATH AND CORONARY ANGIOGRAPHY;  Surgeon: Corky Crafts, MD;  Location: [facility] INVASIVE CV LAB;  Service: Cardiovascular;  Laterality: N/A;  ? TUBAL LIGATION    ? ? ?Current Medications: ?Current Meds  ?Medication Sig  ? ferrous sulfate 325 (65 FE) MG tablet TAKE 1 TABLET (325 MG TOTAL) BY MOUTH DAILY WITH BREAKFAST.  ? potassium chloride SA (KLOR-CON) 20 MEQ tablet TAKE 2 TABLETS (40 MEQ TOTAL) BY MOUTH DAILY. TAKE WITH YOUR LASIX.  ?  [DISCONTINUED] carvedilol (COREG) 25 MG tablet TAKE 1 TABLET (25 MG TOTAL) BY MOUTH 2 (TWO) TIMES DAILY WITH A MEAL.  ?  ? ?Allergies:   Patient has no known allergies.  ? ?Social History  ? ?Socioeconomic History  ? Marital status: Single  ?  Spouse name: Not on file  ? Number of children: 2  ? Years of education: Not on file  ? Highest education level: 12th grade  ?Occupational History  ? Occupation: Restaurant  ?Tobacco Use  ? Smoking status: Former  ?  Years: 10.00  ?  Types: Cigarettes  ? Smokeless tobacco: Never  ?Vaping Use  ? Vaping Use: Never used  ?Substance and Sexual Activity  ? Alcohol use: Never  ? Drug use: Not Currently  ? Sexual activity: Not on file  ?Other Topics Concern  ? Not on file  ?Social History Narrative  ? Not on file  ? ?Social Determinants of Health  ? ?Financial Resource Strain: Not on file  ?Food Insecurity: Not on file  ?Transportation Needs: Not on file  ?Physical Activity: Not on file  ?Stress: Not on file  ?Social Connections: Not on file  ?  ? ?Family History: ?The patient's family history includes Heart attack in her sister; Sudden Cardiac Death (age of onset: 63) in her sister. ? ?ROS:   ?Please see the history of present illness.    ?All other systems reviewed and are  negative. ? ?EKGs/Labs/Other Studies Reviewed:   ? ?The following studies were reviewed today: ? ?02/17/2022 device interrogation personally reviewed ?99% BiV paced ?Changed lead outputs today to maximize battery longevity ?Lead parameter stable ?Battery longevity 7 years ? ?11/09/2021   BIV ICD INSERTION CRT-D ?CONCLUSIONS:  ?1. Successful implantation of a [employer] CRT-D for primary prevention of sudden cardiac death in a patient with NICM and LBBB ?2. No early apparent complications.  ? ?08/12/2021   Cardiac MRI ?IMPRESSION: ?1. Severe LVE with global hypokinesis EF 29% Mild mid myocardial ?uptake consistent with non ischemic DCM ?  ?2. Morphology consistent with ventricular non compaction with ?lateral wall  crypt/myocardial ratio 2.5 to 1 ?  ?3.  Trivial pericardial effusion ?  ?4.  Dilated mPA suggesting elevated pulmonary pressures ?  ?5.  Thickened MV with mild to moderate appearing MR ?  ?6.  Mild LAE ?  ?7.  Normal RV size and function ? ?02/23/2021   Echo ? 1. Left ventricular ejection fraction, by estimation, is 30 to 35%. The  ?left ventricle has moderately decreased function. The left ventricle  ?demonstrates global hypokinesis. The left ventricular internal cavity size  ?was moderately dilated. Left  ?ventricular diastolic parameters are consistent with Grade I diastolic  ?dysfunction (impaired relaxation).  ? 2. Right ventricular systolic function is normal. The right ventricular  ?size is moderately enlarged. There is normal pulmonary artery systolic  ?pressure.  ? 3. Left atrial size was mildly dilated.  ? 4. The mitral valve is normal in structure. Moderate mitral valve  ?regurgitation. No evidence of mitral stenosis.  ? 5. The aortic valve is normal in structure. Aortic valve regurgitation is  ?not visualized. No aortic stenosis is present.  ? 6. The inferior vena cava is normal in size with greater than 50%  ?respiratory variability, suggesting right atrial pressure of 3 mmHg.  ? ?Comparison(s): No significant change from prior study. Prior images  ?reviewed side by side.  ? ?08/22/2020  Right/Left Heart Cath  ?There is severe left ventricular systolic dysfunction. ?The left ventricular ejection fraction is less than 25% by visual estimate. ?LV end diastolic pressure is moderately elevated. ?There is no aortic valve stenosis. ?No angiographically apparent CAD. ?Ao sat 92%, PA sat 61%, PA pressure 33/13, mean 25 mm Hg; mean PCWP 16 mm Hg; CO6.7 L/min; CI 3.6. ?Normal right heart pressures. ?  ?Continue medical therapy for nonischemic cardiomyopathy.  ? ? ?EKG:  EKG is personally reviewed. ?02/17/2022: Sinus rhythm, BiV paced ? ?Recent Labs: ?10/20/2021: BUN 6; Creatinine, Ser 0.72; Hemoglobin 10.9;  Platelets 238; Potassium 4.5; Sodium 139  ? ?Recent Lipid Panel ?   ?Component Value Date/Time  ? CHOL 122 08/21/2020 0209  ? TRIG 40 08/21/2020 0209  ? HDL 32 (L) 08/21/2020 0209  ? CHOLHDL 3.8 08/21/2020 0209  ? VLDL 8 08/21/2020 0209  ? LDLCALC 82 08/21/2020 0209  ? ? ?Physical Exam:   ? ?VS:  BP (!) 186/102   Pulse 70   Ht 5\' 5"  (1.651 m)   Wt 202 lb 9.6 oz (91.9 kg)   SpO2 96%   BMI 33.71 kg/m?    ? ?Wt Readings from Last 3 Encounters:  ?02/17/22 202 lb 9.6 oz (91.9 kg)  ?11/09/21 201 lb (91.2 kg)  ?07/10/21 192 lb 9.6 oz (87.4 kg)  ?  ? ?GEN: Well nourished, well developed in no acute distress ?HEENT: Normal ?NECK: No JVD; No carotid bruits ?LYMPHATICS: No lymphadenopathy ?CARDIAC: RRR, no murmurs, rubs, gallops; Device pocket  well healed ?RESPIRATORY:  Clear to auscultation without rales, wheezing or rhonchi  ?ABDOMEN: Soft, non-tender, non-distended ?MUSCULOSKELETAL:  No edema; No deformity  ?SKIN: Warm and dry ?NEUROLOGIC:  Alert and oriented x 3 ?PSYCHIATRIC:  Normal affect  ? ? ?  ? ?ASSESSMENT:   ? ?1. Combined systolic and diastolic congestive heart failure, unspecified HF chronicity (HCC)   ?2. LBBB (left bundle branch block)   ?3. Cardiac resynchronization therapy defibrillator (CRT-D) in place   ? ?PLAN:   ? ?In order of problems listed above: ? ?#Chronic combined systolic and diastolic heart failure ?#Lipomatous block ?#CRT-D in situ ?NYHA class I-II today.  Warm and dry on exam.  Has been off medications for several months because of unstable housing.  We will plan to refill all of her medications today and change her furosemide to as needed.  She will monitor her weights daily and take Lasix if she develops volume overload. ? ?I will also repeat her echocardiogram now that she is several months after CRT implant. ? ?I will recheck some blood work today to recheck her kidney function and electrolytes. ? ?#CRT-D in situ ?Device functioning appropriately.  Continue remote  monitoring. ? ?Follow-up in 1 year. ? ?Medication Adjustments/Labs and Tests Ordered: ?Current medicines are reviewed at length with the patient today.  Concerns regarding medicines are outlined above.  ? ?Orders Placed This Encounter  ?Procedur

## 2022-02-18 ENCOUNTER — Other Ambulatory Visit: Payer: Self-pay

## 2022-02-18 ENCOUNTER — Telehealth: Payer: Self-pay | Admitting: *Deleted

## 2022-02-18 DIAGNOSIS — I10 Essential (primary) hypertension: Secondary | ICD-10-CM

## 2022-02-18 DIAGNOSIS — I5040 Unspecified combined systolic (congestive) and diastolic (congestive) heart failure: Secondary | ICD-10-CM

## 2022-02-18 MED ORDER — SPIRONOLACTONE 25 MG PO TABS
25.0000 mg | ORAL_TABLET | Freq: Every day | ORAL | 1 refills | Status: AC
  Filled 2022-02-18: qty 15, 15d supply, fill #0

## 2022-02-18 MED ORDER — EMPAGLIFLOZIN 10 MG PO TABS
ORAL_TABLET | Freq: Every day | ORAL | 1 refills | Status: AC
  Filled 2022-02-18: qty 90, 90d supply, fill #0

## 2022-02-18 MED ORDER — FUROSEMIDE 40 MG PO TABS
40.0000 mg | ORAL_TABLET | Freq: Every day | ORAL | 6 refills | Status: AC | PRN
  Filled 2022-02-18: qty 30, 30d supply, fill #0

## 2022-02-18 MED ORDER — CARVEDILOL 25 MG PO TABS
ORAL_TABLET | Freq: Two times a day (BID) | ORAL | 11 refills | Status: AC
  Filled 2022-02-18: qty 60, 30d supply, fill #0
  Filled 2022-04-02: qty 60, 30d supply, fill #1
  Filled 2022-05-18: qty 60, 30d supply, fill #2
  Filled 2022-06-28 (×2): qty 60, 30d supply, fill #3
  Filled 2022-08-10: qty 60, 30d supply, fill #4
  Filled 2022-10-27 – 2022-11-25 (×2): qty 60, 30d supply, fill #5

## 2022-02-18 MED ORDER — SACUBITRIL-VALSARTAN 97-103 MG PO TABS
1.0000 | ORAL_TABLET | Freq: Two times a day (BID) | ORAL | 11 refills | Status: AC
  Filled 2022-02-18: qty 60, 30d supply, fill #0
  Filled 2022-04-02: qty 60, 30d supply, fill #1
  Filled 2022-05-18: qty 60, 30d supply, fill #2
  Filled 2022-06-28: qty 60, 30d supply, fill #3
  Filled 2022-08-10: qty 60, 30d supply, fill #4
  Filled 2022-10-27: qty 60, 30d supply, fill #5

## 2022-02-18 NOTE — Addendum Note (Signed)
Addended by: Darrell Jewel on: 02/18/2022 07:50 AM ? ? Modules accepted: Orders ? ?

## 2022-02-18 NOTE — Addendum Note (Signed)
Addended by: Sampson Goon on: 02/18/2022 04:26 PM ? ? Modules accepted: Orders ? ?

## 2022-02-18 NOTE — Telephone Encounter (Signed)
Labs ordered and scheduled ?

## 2022-02-19 ENCOUNTER — Encounter: Payer: Self-pay | Admitting: *Deleted

## 2022-02-23 ENCOUNTER — Other Ambulatory Visit: Payer: Commercial Managed Care - HMO

## 2022-02-24 NOTE — Progress Notes (Signed)
Remote ICD transmission.   

## 2022-03-03 ENCOUNTER — Ambulatory Visit (HOSPITAL_COMMUNITY): Payer: Commercial Managed Care - HMO | Attending: Cardiovascular Disease

## 2022-03-03 DIAGNOSIS — I110 Hypertensive heart disease with heart failure: Secondary | ICD-10-CM | POA: Diagnosis not present

## 2022-03-03 DIAGNOSIS — Z95810 Presence of automatic (implantable) cardiac defibrillator: Secondary | ICD-10-CM | POA: Insufficient documentation

## 2022-03-03 DIAGNOSIS — Z8249 Family history of ischemic heart disease and other diseases of the circulatory system: Secondary | ICD-10-CM | POA: Insufficient documentation

## 2022-03-03 DIAGNOSIS — I428 Other cardiomyopathies: Secondary | ICD-10-CM | POA: Insufficient documentation

## 2022-03-03 DIAGNOSIS — I081 Rheumatic disorders of both mitral and tricuspid valves: Secondary | ICD-10-CM | POA: Diagnosis not present

## 2022-03-03 DIAGNOSIS — I5040 Unspecified combined systolic (congestive) and diastolic (congestive) heart failure: Secondary | ICD-10-CM | POA: Diagnosis present

## 2022-03-03 DIAGNOSIS — I447 Left bundle-branch block, unspecified: Secondary | ICD-10-CM | POA: Diagnosis present

## 2022-03-03 DIAGNOSIS — Z87891 Personal history of nicotine dependence: Secondary | ICD-10-CM | POA: Insufficient documentation

## 2022-03-03 DIAGNOSIS — I3139 Other pericardial effusion (noninflammatory): Secondary | ICD-10-CM | POA: Insufficient documentation

## 2022-03-03 LAB — ECHOCARDIOGRAM COMPLETE
AR max vel: 2.4 cm2
AV Area VTI: 2.46 cm2
AV Area mean vel: 2.4 cm2
AV Mean grad: 6.5 mmHg
AV Peak grad: 13.4 mmHg
Ao pk vel: 1.83 m/s
Area-P 1/2: 2.48 cm2
S' Lateral: 3.65 cm

## 2022-04-02 ENCOUNTER — Other Ambulatory Visit: Payer: Self-pay

## 2022-04-06 ENCOUNTER — Other Ambulatory Visit: Payer: Self-pay

## 2022-05-16 IMAGING — DX DG CHEST 2V
2 series · 2 of 2 positions shown · non-contrast
Comparison: August 20, 20.

CLINICAL DATA: ICD (implantable cardioverter-defibrillator) in
place EEQ.9FS (JT4-R0-CM)

EXAM:
CHEST - 2 VIEW

[w chest pa]
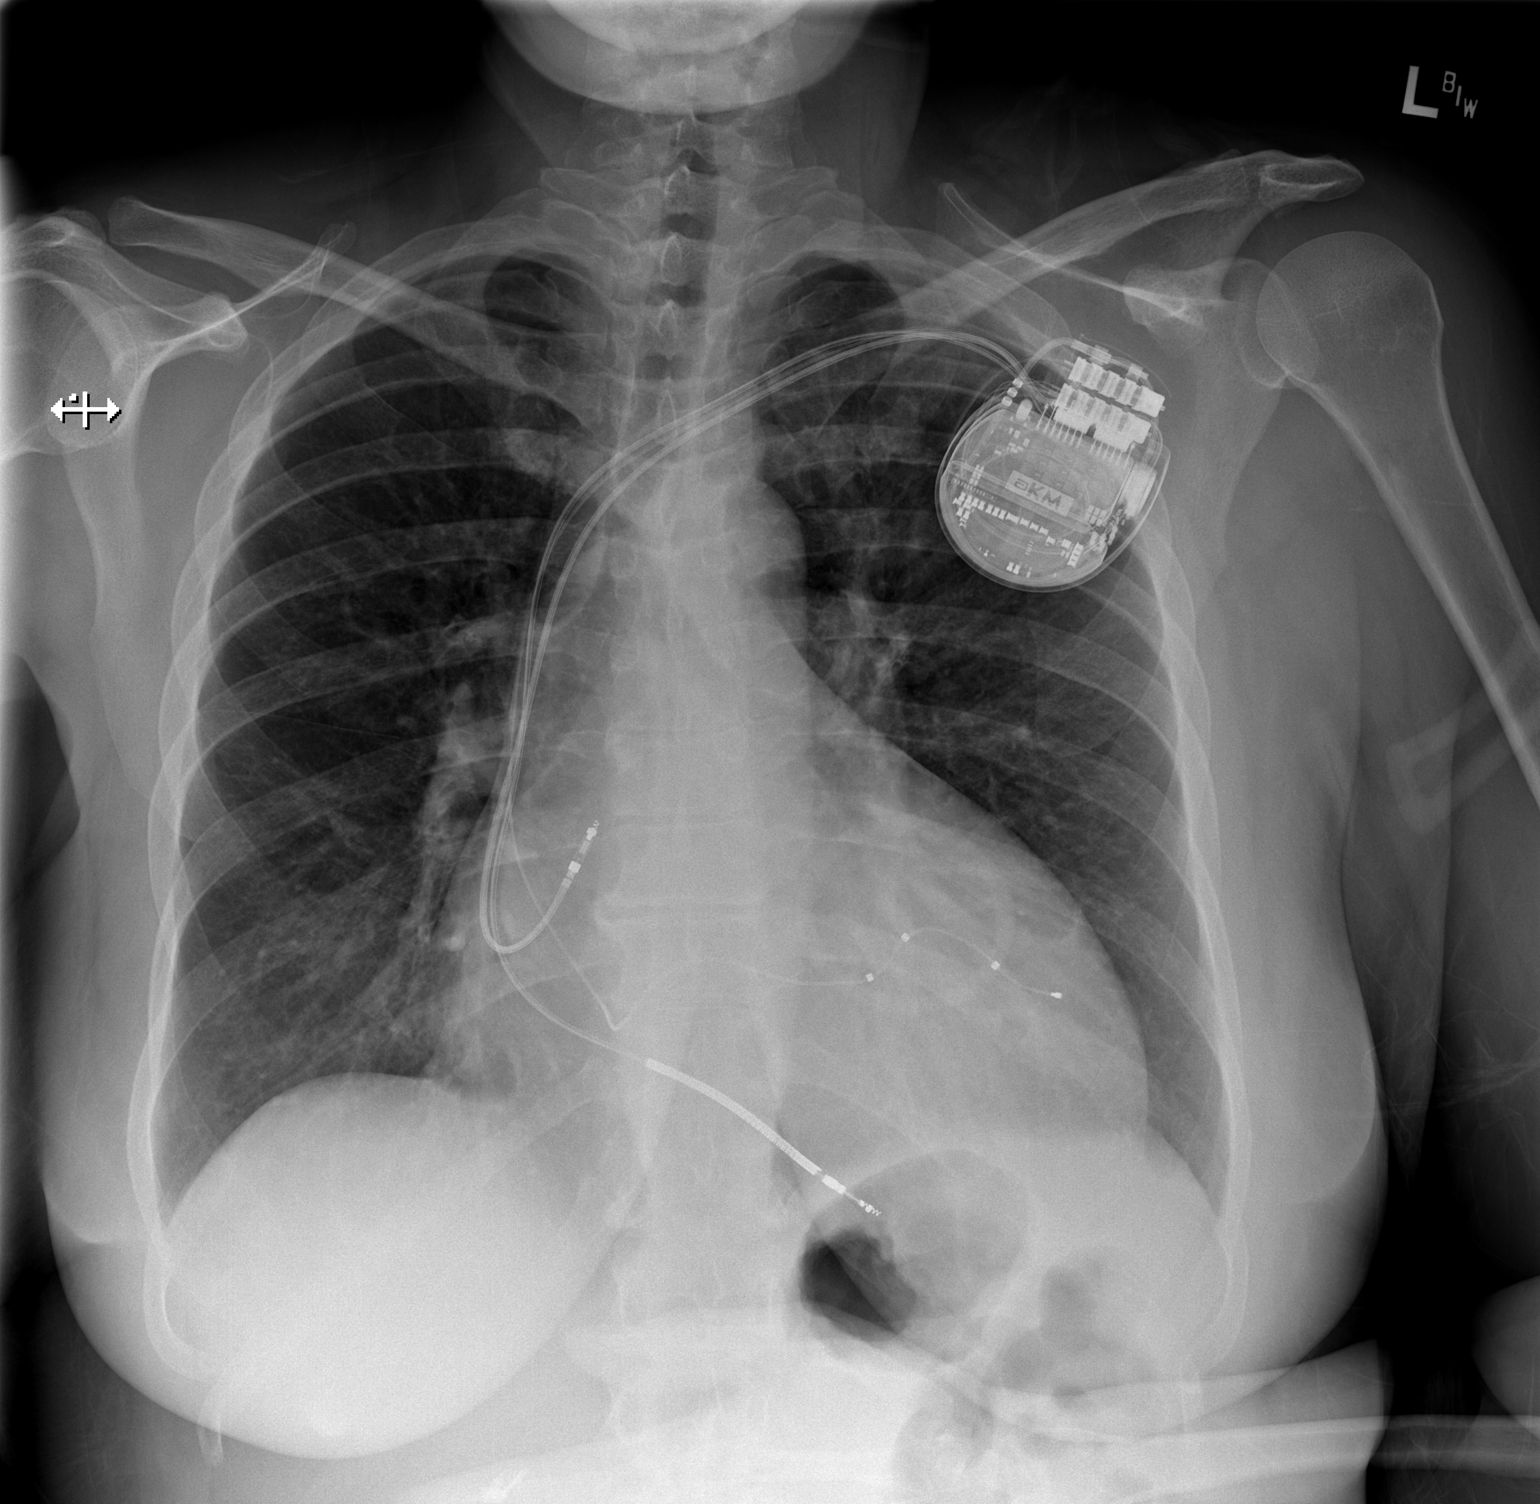

[w chest lat]
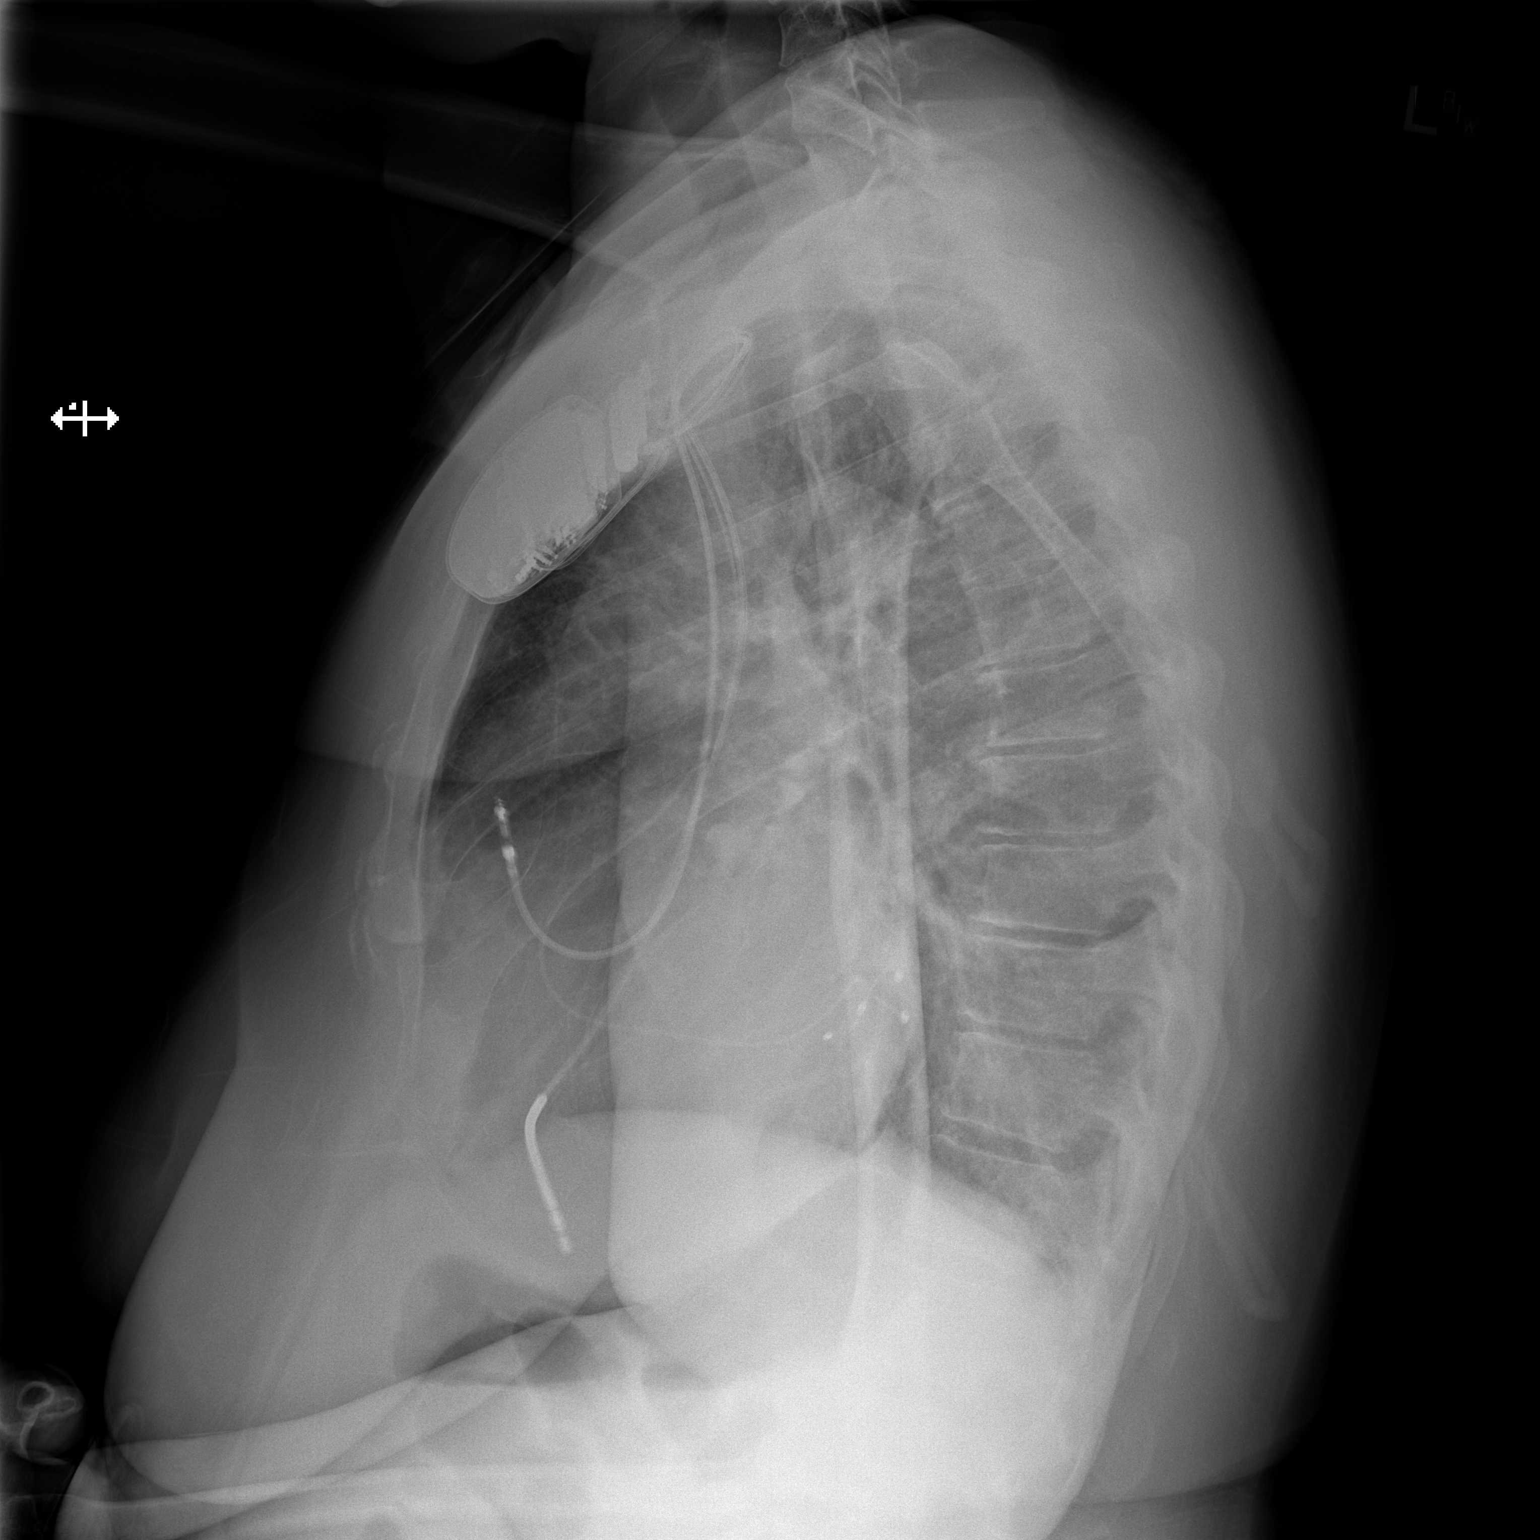

[2 of 2 positions shown; findings below may reference images not displayed]

FINDINGS: Interval placement of a left subclavian approach 3 lead cardiac
rhythm maintenance device. No consolidation. No visible pleural
effusions or pneumothorax. Enlargement of the cardiac silhouette.
Cholecystectomy clips.
IMPRESSION: 1. Interval placement of a left subclavian approach 3 lead cardiac
rhythm maintenance device.
2. No evidence of acute cardiopulmonary disease.
3. Cardiomegaly.

## 2022-05-18 ENCOUNTER — Other Ambulatory Visit: Payer: Self-pay

## 2022-05-20 ENCOUNTER — Other Ambulatory Visit: Payer: Self-pay

## 2022-05-20 IMAGING — CR DG CHEST 2V
2 series · 2 of 2 positions shown · non-contrast
Comparison: 11/09/2021

CLINICAL DATA: LV lead continues to test high for threshold on ICD

EXAM:
CHEST - 2 VIEW

[w chest pa *]
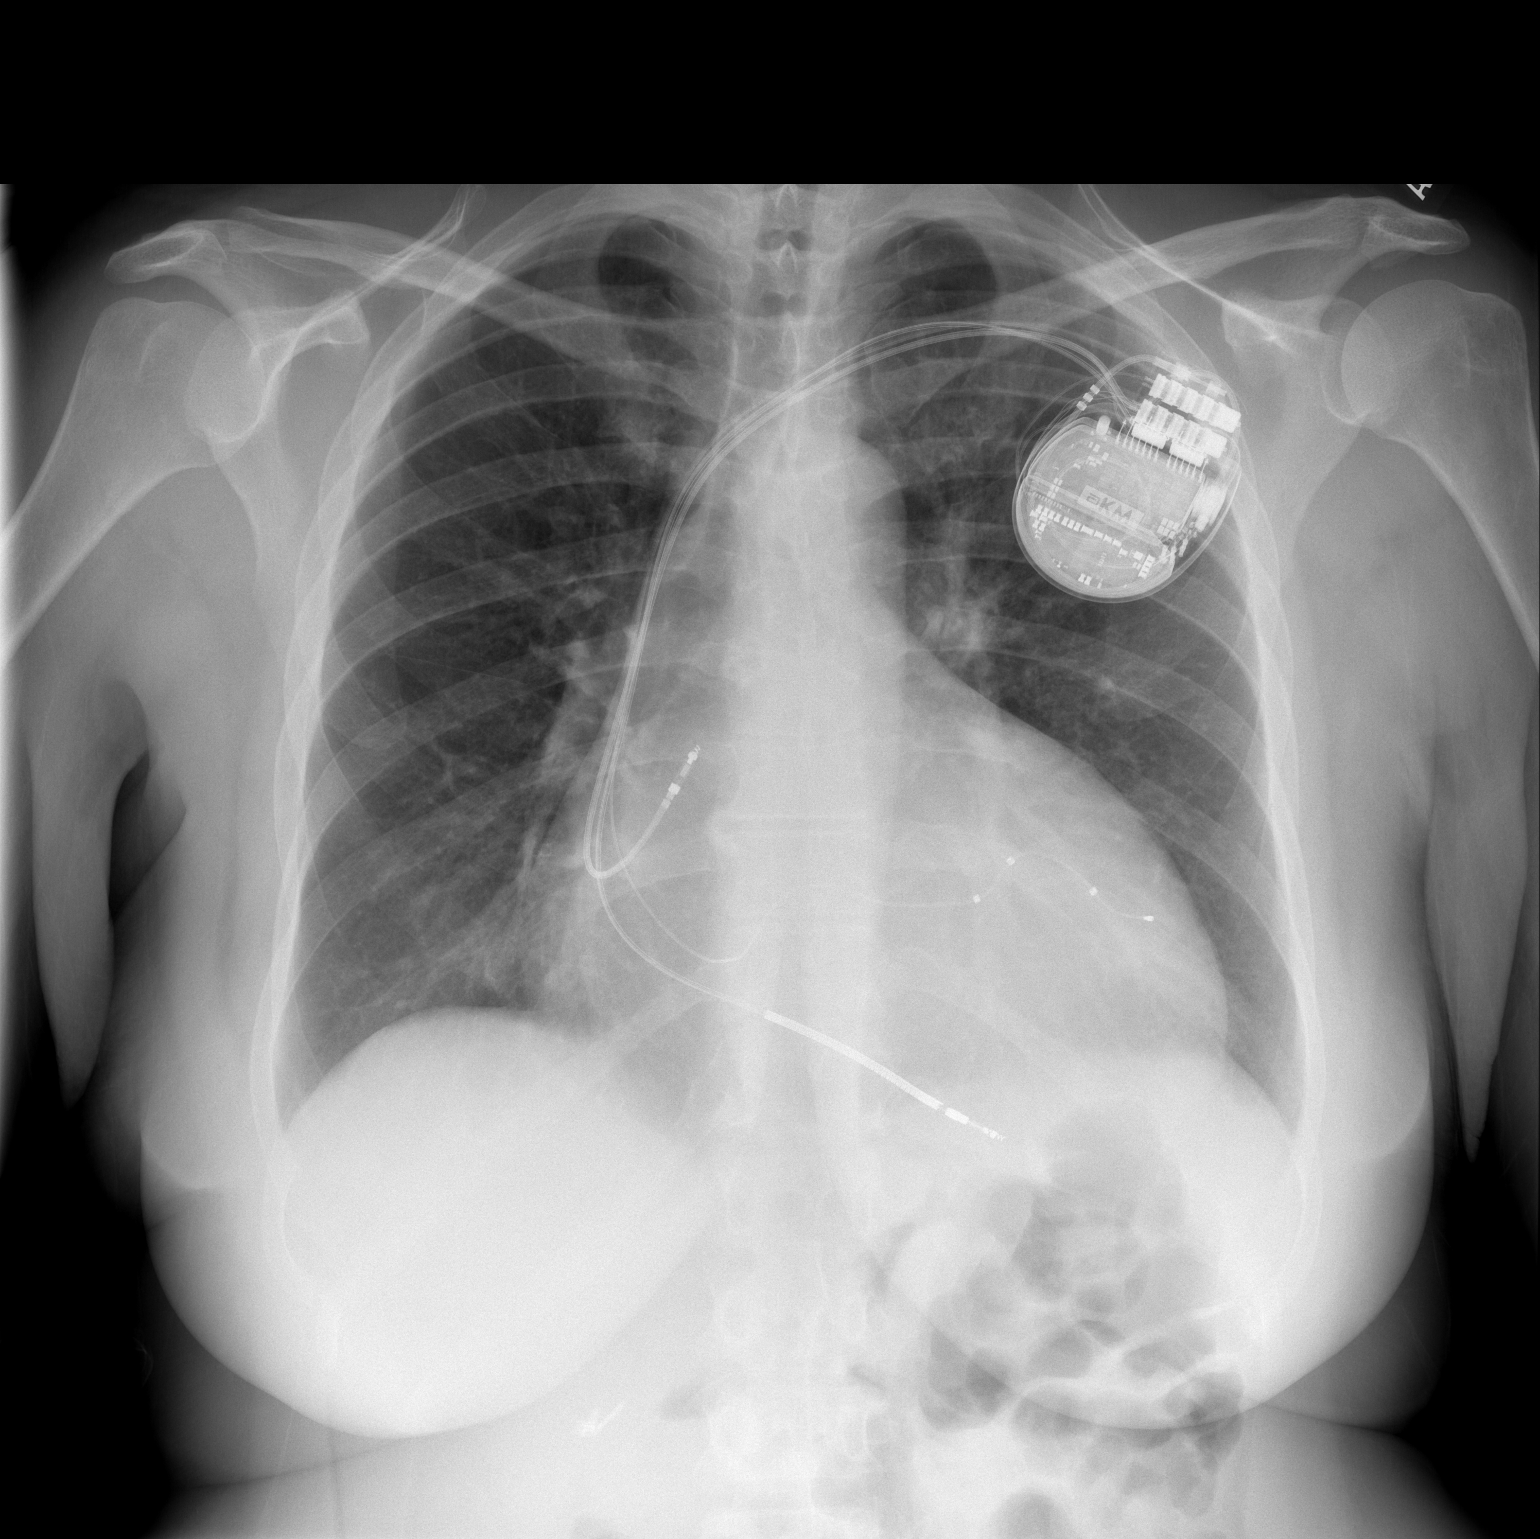

[w chest lat *]
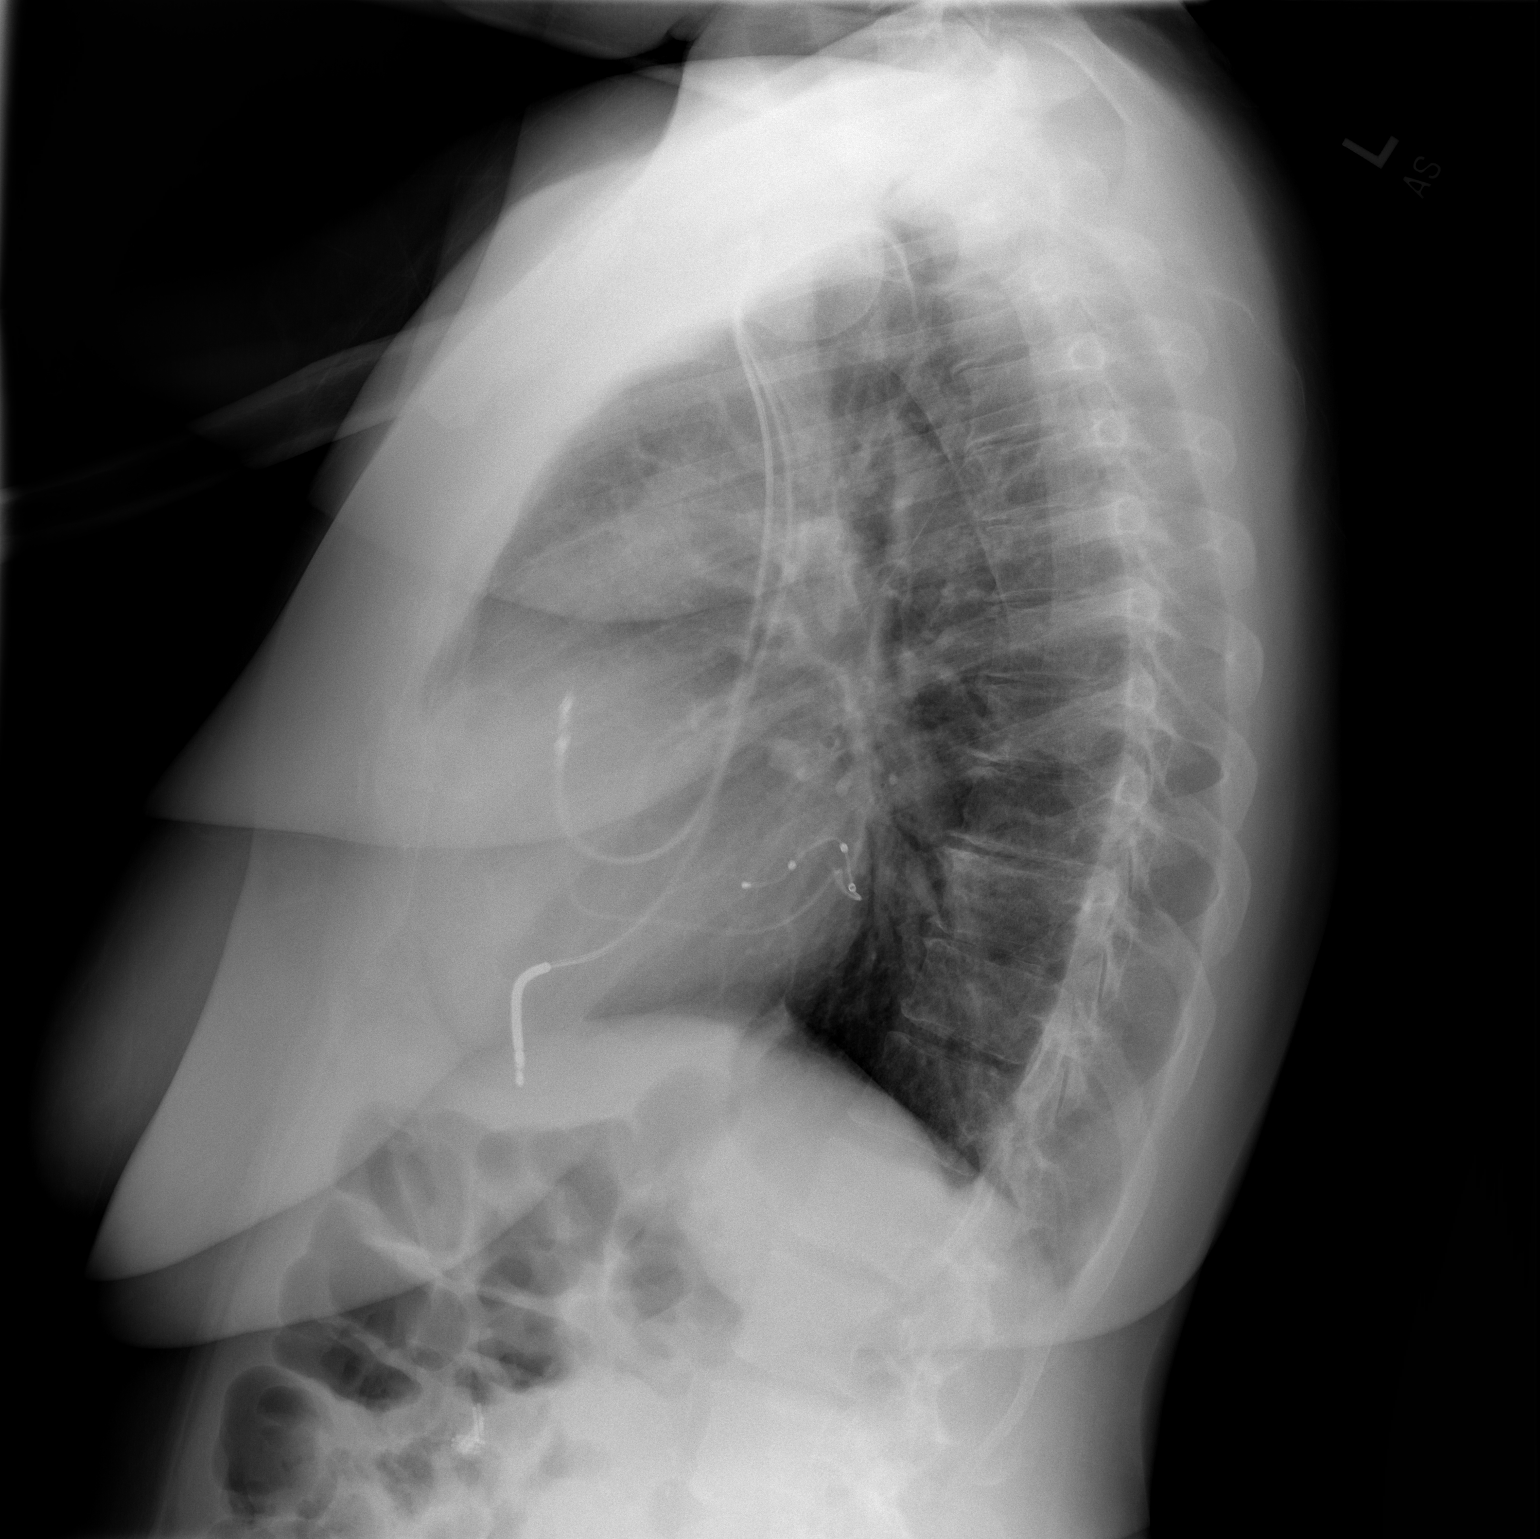

[2 of 2 positions shown; findings below may reference images not displayed]

FINDINGS: No focal consolidation. No pleural effusion or pneumothorax. Stable
cardiomegaly. Three lead cardiac pacemaker with the leads in
satisfactory position.

No acute osseous abnormality.
IMPRESSION: 1. No active cardiopulmonary disease.

## 2022-06-28 ENCOUNTER — Other Ambulatory Visit: Payer: Self-pay

## 2022-07-02 ENCOUNTER — Other Ambulatory Visit: Payer: Self-pay

## 2022-07-12 ENCOUNTER — Other Ambulatory Visit: Payer: Self-pay

## 2022-07-12 ENCOUNTER — Other Ambulatory Visit (HOSPITAL_COMMUNITY): Payer: Self-pay

## 2022-07-12 ENCOUNTER — Encounter (HOSPITAL_COMMUNITY): Payer: Self-pay | Admitting: Emergency Medicine

## 2022-07-12 ENCOUNTER — Emergency Department (HOSPITAL_COMMUNITY)
Admission: EM | Admit: 2022-07-12 | Discharge: 2022-07-12 | Disposition: A | Discharge status: Home / Self Care | Payer: Commercial Managed Care - HMO | Attending: Emergency Medicine | Admitting: Emergency Medicine

## 2022-07-12 DIAGNOSIS — R21 Rash and other nonspecific skin eruption: Secondary | ICD-10-CM | POA: Diagnosis present

## 2022-07-12 MED ORDER — PERMETHRIN 5 % EX CREA
TOPICAL_CREAM | CUTANEOUS | 1 refills | Status: AC
Start: 2022-07-12 — End: ?
  Filled 2022-07-12: qty 60, 2d supply, fill #0
  Filled 2022-07-12: qty 60, fill #0
  Filled 2022-11-25: qty 60, 2d supply, fill #1

## 2022-07-12 NOTE — Discharge Instructions (Addendum)
Please follow up with your doctor.

## 2022-07-12 NOTE — ED Provider Notes (Signed)
  [facility] Emergency Department Provider Note MRN:  [id number]  Arrival date & time: 07/12/22     Chief Complaint   Rashes   History of Present Illness   Isabella Sanders is a 41 y.o. year-old female presents to the ED with chief complaint of rash around wrists and ankles x 1 week.  States that she had similar about a year ago.  Used permethrin a year ago with good results (thought then to be scabies).  She has tried using hydrocortisone without relief.  She states that it itches.  She denies any other symptoms.  She denies fever, tick bites.  History provided by patient.   Review of Systems  Pertinent positive and negative review of systems noted in HPI.    Physical Exam   Vitals:   07/12/22 0048  BP: (!) 167/97  Pulse: 63  Resp: 16  Temp: 98.4 F (36.9 C)  SpO2: 100%    CONSTITUTIONAL:  well-appearing, NAD NEURO:  Alert and oriented x 3, CN 3-12 grossly intact EYES:  eyes equal and reactive ENT/NECK:  Supple, no stridor  CARDIO:  appears well-perfused  PULM:  No respiratory distress,  GI/GU:  non-distended,  MSK/SPINE:  No gross deformities, no edema, moves all extremities  SKIN:  papular rash to wrists and ankles and in between fingers, sparing the palms and soles   *Additional and/or pertinent findings included in MDM below  Diagnostic and Interventional Summary    EKG Interpretation  Date/Time:    Ventricular Rate:    PR Interval:    QRS Duration:   QT Interval:    QTC Calculation:   R Axis:     Text Interpretation:         Labs Reviewed - No data to display  No orders to display    Medications - No data to display   Procedures  /  Critical Care Procedures  ED Course and Medical Decision Making  I have reviewed the triage vital signs, the nursing notes, and pertinent available records from the EMR.  Social Determinants Affecting Complexity of Care: Patient has no clinically significant social determinants affecting  this chief complaint..   ED Course:    Medical Decision Making Rash of uncertain etiology.  Consider scabies, eczema, syphilis.  Patient states she just wants treatment for scabies and will follow-up with PCP if not improving.  Risk Prescription drug management.     Consultants: No consultations were needed in caring for this patient.   Treatment and Plan: Emergency department workup does not suggest an emergent condition requiring admission or immediate intervention beyond  what has been performed at this time. The patient is safe for discharge and has  been instructed to return immediately for worsening symptoms, change in  symptoms or any other concerns    Final Clinical Impressions(s) / ED Diagnoses     ICD-10-CM   1. Rash and nonspecific skin eruption  R21       ED Discharge Orders          Ordered    permethrin (ELIMITE) 5 % cream        07/12/22 0104              Discharge Instructions Discussed with and Provided to Patient:    Discharge Instructions      Please follow-up with your doctor.      Montine Circle, PA-C 07/12/22 [id number]    Palumbo, April, MD 07/12/22 0120

## 2022-07-12 NOTE — ED Triage Notes (Signed)
Patient reports itchy skin rashes at hands and wrist onset last week .

## 2022-08-10 ENCOUNTER — Ambulatory Visit (INDEPENDENT_AMBULATORY_CARE_PROVIDER_SITE_OTHER): Payer: Managed Care, Other (non HMO)

## 2022-08-10 DIAGNOSIS — I428 Other cardiomyopathies: Secondary | ICD-10-CM

## 2022-08-11 ENCOUNTER — Other Ambulatory Visit: Payer: Self-pay

## 2022-08-11 ENCOUNTER — Telehealth: Payer: Self-pay

## 2022-08-11 LAB — CUP PACEART REMOTE DEVICE CHECK
Battery Remaining Longevity: 76 mo
Battery Remaining Percentage: 88 %
Battery Voltage: 2.99 V
Brady Statistic AP VP Percent: 15 %
Brady Statistic AP VS Percent: 1 %
Brady Statistic AS VP Percent: 85 %
Brady Statistic AS VS Percent: 1 %
Brady Statistic RA Percent Paced: 14 %
Date Time Interrogation Session: 20231101115336
HighPow Impedance: 55 Ohm
Implantable Lead Connection Status: 753985
Implantable Lead Connection Status: 753985
Implantable Lead Connection Status: 753985
Implantable Lead Implant Date: 20230130
Implantable Lead Implant Date: 20230130
Implantable Lead Implant Date: 20230130
Implantable Lead Location: 753858
Implantable Lead Location: 753859
Implantable Lead Location: 753860
Implantable Pulse Generator Implant Date: 20230130
Lead Channel Impedance Value: 450 Ohm
Lead Channel Impedance Value: 500 Ohm
Lead Channel Impedance Value: 510 Ohm
Lead Channel Pacing Threshold Amplitude: 0.5 V
Lead Channel Pacing Threshold Amplitude: 0.75 V
Lead Channel Pacing Threshold Amplitude: 0.75 V
Lead Channel Pacing Threshold Pulse Width: 0.5 ms
Lead Channel Pacing Threshold Pulse Width: 0.5 ms
Lead Channel Pacing Threshold Pulse Width: 0.8 ms
Lead Channel Sensing Intrinsic Amplitude: 1.5 mV
Lead Channel Sensing Intrinsic Amplitude: 12 mV
Lead Channel Setting Pacing Amplitude: 1.75 V
Lead Channel Setting Pacing Amplitude: 2 V
Lead Channel Setting Pacing Amplitude: 2.5 V
Lead Channel Setting Pacing Pulse Width: 0.5 ms
Lead Channel Setting Pacing Pulse Width: 0.8 ms
Lead Channel Setting Sensing Sensitivity: 0.5 mV
Pulse Gen Serial Number: 111057717
Zone Setting Status: 755011

## 2022-08-11 NOTE — Telephone Encounter (Signed)
Help patient reinstall her Merlin Pulse app. Transmission received.

## 2022-08-16 ENCOUNTER — Other Ambulatory Visit: Payer: Self-pay

## 2022-08-24 NOTE — Progress Notes (Signed)
Remote ICD transmission.   

## 2022-10-27 ENCOUNTER — Other Ambulatory Visit: Payer: Self-pay

## 2022-10-28 ENCOUNTER — Other Ambulatory Visit: Payer: Self-pay

## 2022-11-03 ENCOUNTER — Other Ambulatory Visit: Payer: Self-pay

## 2022-11-09 ENCOUNTER — Ambulatory Visit: Payer: Managed Care, Other (non HMO)

## 2022-11-09 DIAGNOSIS — I428 Other cardiomyopathies: Secondary | ICD-10-CM

## 2022-11-10 LAB — CUP PACEART REMOTE DEVICE CHECK
Battery Remaining Longevity: 73 mo
Battery Remaining Percentage: 84 %
Battery Voltage: 2.99 V
Brady Statistic AP VP Percent: 14 %
Brady Statistic AP VS Percent: 1 %
Brady Statistic AS VP Percent: 86 %
Brady Statistic AS VS Percent: 1 %
Brady Statistic RA Percent Paced: 14 %
Date Time Interrogation Session: 20240131100330
HighPow Impedance: 55 Ohm
Implantable Lead Connection Status: 753985
Implantable Lead Connection Status: 753985
Implantable Lead Connection Status: 753985
Implantable Lead Implant Date: 20230130
Implantable Lead Implant Date: 20230130
Implantable Lead Implant Date: 20230130
Implantable Lead Location: 753858
Implantable Lead Location: 753859
Implantable Lead Location: 753860
Implantable Pulse Generator Implant Date: 20230130
Lead Channel Impedance Value: 440 Ohm
Lead Channel Impedance Value: 480 Ohm
Lead Channel Impedance Value: 500 Ohm
Lead Channel Pacing Threshold Amplitude: 0.5 V
Lead Channel Pacing Threshold Amplitude: 0.75 V
Lead Channel Pacing Threshold Amplitude: 1.125 V
Lead Channel Pacing Threshold Pulse Width: 0.5 ms
Lead Channel Pacing Threshold Pulse Width: 0.5 ms
Lead Channel Pacing Threshold Pulse Width: 0.8 ms
Lead Channel Sensing Intrinsic Amplitude: 1.5 mV
Lead Channel Sensing Intrinsic Amplitude: 12 mV
Lead Channel Setting Pacing Amplitude: 1.75 V
Lead Channel Setting Pacing Amplitude: 2 V
Lead Channel Setting Pacing Amplitude: 2.5 V
Lead Channel Setting Pacing Pulse Width: 0.5 ms
Lead Channel Setting Pacing Pulse Width: 0.8 ms
Lead Channel Setting Sensing Sensitivity: 0.5 mV
Pulse Gen Serial Number: 111057717
Zone Setting Status: 755011

## 2022-11-25 ENCOUNTER — Other Ambulatory Visit: Payer: Self-pay

## 2022-12-03 NOTE — Progress Notes (Signed)
Remote ICD transmission.   

## 2023-02-08 ENCOUNTER — Ambulatory Visit (INDEPENDENT_AMBULATORY_CARE_PROVIDER_SITE_OTHER): Payer: 59

## 2023-02-08 DIAGNOSIS — I428 Other cardiomyopathies: Secondary | ICD-10-CM

## 2023-02-11 LAB — CUP PACEART REMOTE DEVICE CHECK
Battery Remaining Longevity: 70 mo
Battery Remaining Percentage: 81 %
Battery Voltage: 2.99 V
Brady Statistic AP VP Percent: 13 %
Brady Statistic AP VS Percent: 1 %
Brady Statistic AS VP Percent: 87 %
Brady Statistic AS VS Percent: 1 %
Brady Statistic RA Percent Paced: 13 %
Date Time Interrogation Session: 20240503102936
HighPow Impedance: 52 Ohm
Implantable Lead Connection Status: 753985
Implantable Lead Connection Status: 753985
Implantable Lead Connection Status: 753985
Implantable Lead Implant Date: 20230130
Implantable Lead Implant Date: 20230130
Implantable Lead Implant Date: 20230130
Implantable Lead Location: 753858
Implantable Lead Location: 753859
Implantable Lead Location: 753860
Implantable Pulse Generator Implant Date: 20230130
Lead Channel Impedance Value: 480 Ohm
Lead Channel Impedance Value: 480 Ohm
Lead Channel Impedance Value: 500 Ohm
Lead Channel Pacing Threshold Amplitude: 0.5 V
Lead Channel Pacing Threshold Amplitude: 0.75 V
Lead Channel Pacing Threshold Amplitude: 1 V
Lead Channel Pacing Threshold Pulse Width: 0.5 ms
Lead Channel Pacing Threshold Pulse Width: 0.5 ms
Lead Channel Pacing Threshold Pulse Width: 0.8 ms
Lead Channel Sensing Intrinsic Amplitude: 1.3 mV
Lead Channel Sensing Intrinsic Amplitude: 12 mV
Lead Channel Setting Pacing Amplitude: 1.75 V
Lead Channel Setting Pacing Amplitude: 2 V
Lead Channel Setting Pacing Amplitude: 2.5 V
Lead Channel Setting Pacing Pulse Width: 0.5 ms
Lead Channel Setting Pacing Pulse Width: 0.8 ms
Lead Channel Setting Sensing Sensitivity: 0.5 mV
Pulse Gen Serial Number: 111057717
Zone Setting Status: 755011

## 2023-03-02 NOTE — Progress Notes (Signed)
Remote ICD transmission.   

## 2023-04-07 ENCOUNTER — Encounter: Payer: Self-pay | Admitting: Cardiovascular Disease

## 2023-05-13 ENCOUNTER — Ambulatory Visit (INDEPENDENT_AMBULATORY_CARE_PROVIDER_SITE_OTHER): Payer: 59

## 2023-05-13 DIAGNOSIS — I428 Other cardiomyopathies: Secondary | ICD-10-CM

## 2023-05-23 NOTE — Progress Notes (Signed)
Remote ICD transmission.   

## 2023-08-03 ENCOUNTER — Ambulatory Visit
Admission: EM | Admit: 2023-08-03 | Discharge: 2023-08-03 | Disposition: A | Discharge status: Home / Self Care | Payer: 59 | Attending: Internal Medicine | Admitting: Internal Medicine

## 2023-08-03 DIAGNOSIS — H6122 Impacted cerumen, left ear: Secondary | ICD-10-CM | POA: Diagnosis not present

## 2023-08-03 NOTE — ED Triage Notes (Signed)
Patient reports left ear was itching a lot on Saturday and feels clogged. Treated with ear drops 1 day.

## 2023-08-03 NOTE — ED Provider Notes (Signed)
[facility]    CSN: [id number] Arrival date & time: 08/03/23  1821      History   Chief Complaint No chief complaint on file.   HPI Isabella Sanders is a 42 y.o. female.   Patient presents with feelings of left ear fullness and itching that started about 5 days ago.  Denies any injury or trauma to the ear.  Denies any fever.  Has used over-the-counter eardrops with minimal improvement.     Past Medical History:  Diagnosis Date   CHF (congestive heart failure) (HCC)    Hypertension    Tobacco abuse     Patient Active Problem List   Diagnosis Date Noted   Influenza vaccine refused 09/18/2020   Former smoker 09/18/2020   Iron deficiency anemia due to chronic blood loss 09/18/2020   Acute systolic heart failure (HCC)    Combined systolic and diastolic congestive heart failure (HCC) 08/20/2020   Microcytic hypochromic anemia 08/20/2020   Alcohol use disorder, mild, in early remission, abuse 08/20/2020   Tobacco abuse 08/20/2020   Essential hypertension 08/20/2020    Past Surgical History:  Procedure Laterality Date   BIV ICD INSERTION CRT-D N/A 11/09/2021   Procedure: BIV ICD INSERTION CRT-D;  Surgeon: Lanier Prude, MD;  Location: [facility] INVASIVE CV LAB;  Service: Cardiovascular;  Laterality: N/A;   CARDIAC CATHETERIZATION     CESAREAN SECTION     CHOLECYSTECTOMY     RIGHT/LEFT HEART CATH AND CORONARY ANGIOGRAPHY N/A 08/22/2020   Procedure: RIGHT/LEFT HEART CATH AND CORONARY ANGIOGRAPHY;  Surgeon: Corky Crafts, MD;  Location: [facility] INVASIVE CV LAB;  Service: Cardiovascular;  Laterality: N/A;   TUBAL LIGATION      OB History   No obstetric history on file.      Home Medications    Prior to Admission medications   Medication Sig Start Date End Date Taking? Authorizing Provider  carvedilol (COREG) 25 MG tablet TAKE 1 TABLET (25 MG TOTAL) BY MOUTH 2 (TWO) TIMES DAILY WITH A MEAL. 02/18/22   Lanier Prude, MD  empagliflozin (JARDIANCE) 10  MG TABS tablet TAKE 1 TABLET (10 MG TOTAL) BY MOUTH DAILY BEFORE BREAKFAST. 02/18/22   Lanier Prude, MD  ferrous sulfate 325 (65 FE) MG tablet TAKE 1 TABLET (325 MG TOTAL) BY MOUTH DAILY WITH BREAKFAST. 03/06/21 03/06/22  Marcine Matar, MD  furosemide (LASIX) 40 MG tablet Take 1 tablet (40 mg total) by mouth daily as needed for fluid (3 pound weight gain). 02/18/22   Lanier Prude, MD  permethrin (ELIMITE) 5 % cream Apply to entire body other than face - let sit for 14 hours then wash off, may repeat in 1 week if still having symptoms 07/12/22   Roxy Horseman, PA-C  potassium chloride SA (KLOR-CON) 20 MEQ tablet TAKE 2 TABLETS (40 MEQ TOTAL) BY MOUTH DAILY. TAKE WITH YOUR LASIX. 07/10/21 07/10/22  Sande Rives, MD  sacubitril-valsartan (ENTRESTO) 97-103 MG TAKE 1 TABLET BY MOUTH 2 (TWO) TIMES DAILY. 02/18/22   Lanier Prude, MD  spironolactone (ALDACTONE) 25 MG tablet Take 1 tablet (25 mg total) by mouth daily. OFFICE VISIT NEEDED FOR ADDITIONAL REFILLS 02/18/22   Lanier Prude, MD  digoxin (LANOXIN) 0.125 MG tablet Take 1 tablet (0.125 mg total) by mouth daily. 09/18/20 12/04/20  Marcine Matar, MD    Family History Family History  Problem Relation Age of Onset   Sudden Cardiac Death Sister 22   Heart attack Sister        "  mild" heart attack in her 73's    Social History Social History   Tobacco Use   Smoking status: Former    Types: Cigarettes   Smokeless tobacco: Never  Vaping Use   Vaping status: Never Used  Substance Use Topics   Alcohol use: Yes    Comment: occasionally   Drug use: Not Currently     Allergies   Patient has no known allergies.   Review of Systems Review of Systems Per HPI  Physical Exam Triage Vital Signs ED Triage Vitals  Encounter Vitals Group     BP 08/03/23 1931 (!) 155/88     Systolic BP Percentile --      Diastolic BP Percentile --      Pulse Rate 08/03/23 1931 76     Resp 08/03/23 1931 18     Temp 08/03/23  1931 98.7 F (37.1 C)     Temp Source 08/03/23 1931 Oral     SpO2 08/03/23 1931 99 %     Weight 08/03/23 1929 190 lb (86.2 kg)     Height 08/03/23 1929 5\' 6"  (1.676 m)     Head Circumference --      Peak Flow --      Pain Score 08/03/23 1929 0     Pain Loc --      Pain Education --      Exclude from Growth Chart --    No data found.  Updated Vital Signs BP (!) 155/88 (BP Location: Right Arm)   Pulse 76   Temp 98.7 F (37.1 C) (Oral)   Resp 18   Ht 5\' 6"  (1.676 m)   Wt 190 lb (86.2 kg)   LMP 08/02/2023   SpO2 99%   BMI 30.67 kg/m   Visual Acuity Right Eye Distance:   Left Eye Distance:   Bilateral Distance:    Right Eye Near:   Left Eye Near:    Bilateral Near:     Physical Exam Constitutional:      General: She is not in acute distress.    Appearance: Normal appearance. She is not toxic-appearing or diaphoretic.  HENT:     Head: Normocephalic and atraumatic.     Left Ear: Tympanic membrane and external ear normal. No drainage, swelling or tenderness.  No middle ear effusion. There is impacted cerumen. No foreign body. Tympanic membrane is not scarred, perforated, erythematous or bulging.     Ears:     Comments: Patient had impacted cerumen to left ear canal on original physical exam.  Ear was irrigated with successful removal of cerumen.  On second physical exam, ear appears normal. Eyes:     Extraocular Movements: Extraocular movements intact.     Conjunctiva/sclera: Conjunctivae normal.  Pulmonary:     Effort: Pulmonary effort is normal.  Neurological:     General: No focal deficit present.     Mental Status: She is alert and oriented to person, place, and time. Mental status is at baseline.  Psychiatric:        Mood and Affect: Mood normal.        Behavior: Behavior normal.        Thought Content: Thought content normal.        Judgment: Judgment normal.      UC Treatments / Results  Labs (all labs ordered are listed, but only abnormal results are  displayed) Labs Reviewed - No data to display  EKG   Radiology No results found.  Procedures Procedures (including  critical care time)  Medications Ordered in UC Medications - No data to display  Initial Impression / Assessment and Plan / UC Course  I have reviewed the triage vital signs and the nursing notes.  Pertinent labs & imaging results that were available during my care of the patient were reviewed by me and considered in my medical decision making (see chart for details).     Patient had impacted cerumen on original exam.  Ear was irrigated by clinical staff with successful removal of cerumen.  On second physical exam, ear appears normal.  Advised patient to follow-up with any further concerns.  Patient verbalized understanding and was agreeable with plan. Final Clinical Impressions(s) / UC Diagnoses   Final diagnoses:  Impacted cerumen of left ear   Discharge Instructions   None    ED Prescriptions   None    PDMP not reviewed this encounter.   Gustavus Bryant, [place] 08/04/23 [phone]

## 2023-08-12 ENCOUNTER — Ambulatory Visit (INDEPENDENT_AMBULATORY_CARE_PROVIDER_SITE_OTHER): Payer: Commercial Managed Care - HMO

## 2023-08-12 DIAGNOSIS — I428 Other cardiomyopathies: Secondary | ICD-10-CM

## 2023-08-14 LAB — CUP PACEART REMOTE DEVICE CHECK
Battery Remaining Longevity: 64 mo
Battery Remaining Percentage: 74 %
Battery Voltage: 2.98 V
Brady Statistic AP VP Percent: 14 %
Brady Statistic AP VS Percent: 1 %
Brady Statistic AS VP Percent: 86 %
Brady Statistic AS VS Percent: 1 %
Brady Statistic RA Percent Paced: 13 %
Date Time Interrogation Session: 20241101113311
HighPow Impedance: 55 Ohm
Implantable Lead Connection Status: 753985
Implantable Lead Connection Status: 753985
Implantable Lead Connection Status: 753985
Implantable Lead Implant Date: 20230130
Implantable Lead Implant Date: 20230130
Implantable Lead Implant Date: 20230130
Implantable Lead Location: 753858
Implantable Lead Location: 753859
Implantable Lead Location: 753860
Implantable Pulse Generator Implant Date: 20230130
Lead Channel Impedance Value: 450 Ohm
Lead Channel Impedance Value: 480 Ohm
Lead Channel Impedance Value: 510 Ohm
Lead Channel Pacing Threshold Amplitude: 0.5 V
Lead Channel Pacing Threshold Amplitude: 0.75 V
Lead Channel Pacing Threshold Amplitude: 1.125 V
Lead Channel Pacing Threshold Pulse Width: 0.5 ms
Lead Channel Pacing Threshold Pulse Width: 0.5 ms
Lead Channel Pacing Threshold Pulse Width: 0.8 ms
Lead Channel Sensing Intrinsic Amplitude: 12 mV
Lead Channel Sensing Intrinsic Amplitude: 2.1 mV
Lead Channel Setting Pacing Amplitude: 1.75 V
Lead Channel Setting Pacing Amplitude: 2 V
Lead Channel Setting Pacing Amplitude: 2.5 V
Lead Channel Setting Pacing Pulse Width: 0.5 ms
Lead Channel Setting Pacing Pulse Width: 0.8 ms
Lead Channel Setting Sensing Sensitivity: 0.5 mV
Pulse Gen Serial Number: 111057717
Zone Setting Status: 755011

## 2023-08-24 NOTE — Progress Notes (Signed)
Remote ICD transmission.   

## 2023-11-08 ENCOUNTER — Ambulatory Visit (INDEPENDENT_AMBULATORY_CARE_PROVIDER_SITE_OTHER): Payer: Self-pay

## 2023-11-08 ENCOUNTER — Encounter: Payer: Self-pay | Admitting: Cardiology

## 2023-11-08 DIAGNOSIS — I428 Other cardiomyopathies: Secondary | ICD-10-CM | POA: Diagnosis not present

## 2023-11-08 DIAGNOSIS — I5040 Unspecified combined systolic (congestive) and diastolic (congestive) heart failure: Secondary | ICD-10-CM

## 2023-11-08 LAB — CUP PACEART REMOTE DEVICE CHECK
Battery Remaining Longevity: 61 mo
Battery Remaining Percentage: 71 %
Battery Voltage: 2.98 V
Brady Statistic AP VP Percent: 12 %
Brady Statistic AP VS Percent: 1 %
Brady Statistic AS VP Percent: 87 %
Brady Statistic AS VS Percent: 1 %
Brady Statistic RA Percent Paced: 12 %
Date Time Interrogation Session: 20250128010229
HighPow Impedance: 55 Ohm
Implantable Lead Connection Status: 753985
Implantable Lead Connection Status: 753985
Implantable Lead Connection Status: 753985
Implantable Lead Implant Date: 20230130
Implantable Lead Implant Date: 20230130
Implantable Lead Implant Date: 20230130
Implantable Lead Location: 753858
Implantable Lead Location: 753859
Implantable Lead Location: 753860
Implantable Pulse Generator Implant Date: 20230130
Lead Channel Impedance Value: 440 Ohm
Lead Channel Impedance Value: 450 Ohm
Lead Channel Impedance Value: 450 Ohm
Lead Channel Pacing Threshold Amplitude: 0.5 V
Lead Channel Pacing Threshold Amplitude: 0.875 V
Lead Channel Pacing Threshold Amplitude: 1.25 V
Lead Channel Pacing Threshold Pulse Width: 0.5 ms
Lead Channel Pacing Threshold Pulse Width: 0.5 ms
Lead Channel Pacing Threshold Pulse Width: 0.8 ms
Lead Channel Sensing Intrinsic Amplitude: 1.6 mV
Lead Channel Sensing Intrinsic Amplitude: 12 mV
Lead Channel Setting Pacing Amplitude: 1.875
Lead Channel Setting Pacing Amplitude: 2 V
Lead Channel Setting Pacing Amplitude: 2.5 V
Lead Channel Setting Pacing Pulse Width: 0.5 ms
Lead Channel Setting Pacing Pulse Width: 0.8 ms
Lead Channel Setting Sensing Sensitivity: 0.5 mV
Pulse Gen Serial Number: 111057717
Zone Setting Status: 755011

## 2023-12-19 NOTE — Progress Notes (Signed)
 Remote ICD transmission.

## 2024-02-07 ENCOUNTER — Ambulatory Visit (INDEPENDENT_AMBULATORY_CARE_PROVIDER_SITE_OTHER): Payer: Self-pay

## 2024-02-07 DIAGNOSIS — I428 Other cardiomyopathies: Secondary | ICD-10-CM

## 2024-02-07 LAB — CUP PACEART REMOTE DEVICE CHECK
Battery Remaining Longevity: 57 mo
Battery Remaining Percentage: 67 %
Battery Voltage: 2.98 V
Brady Statistic AP VP Percent: 12 %
Brady Statistic AP VS Percent: 1 %
Brady Statistic AS VP Percent: 88 %
Brady Statistic AS VS Percent: 1 %
Brady Statistic RA Percent Paced: 11 %
Date Time Interrogation Session: 20250429030420
HighPow Impedance: 59 Ohm
Implantable Lead Connection Status: 753985
Implantable Lead Connection Status: 753985
Implantable Lead Connection Status: 753985
Implantable Lead Implant Date: 20230130
Implantable Lead Implant Date: 20230130
Implantable Lead Implant Date: 20230130
Implantable Lead Location: 753858
Implantable Lead Location: 753859
Implantable Lead Location: 753860
Implantable Pulse Generator Implant Date: 20230130
Lead Channel Impedance Value: 450 Ohm
Lead Channel Impedance Value: 450 Ohm
Lead Channel Impedance Value: 510 Ohm
Lead Channel Pacing Threshold Amplitude: 0.5 V
Lead Channel Pacing Threshold Amplitude: 0.75 V
Lead Channel Pacing Threshold Amplitude: 1.375 V
Lead Channel Pacing Threshold Pulse Width: 0.5 ms
Lead Channel Pacing Threshold Pulse Width: 0.5 ms
Lead Channel Pacing Threshold Pulse Width: 0.8 ms
Lead Channel Sensing Intrinsic Amplitude: 1.6 mV
Lead Channel Sensing Intrinsic Amplitude: 12 mV
Lead Channel Setting Pacing Amplitude: 1.75 V
Lead Channel Setting Pacing Amplitude: 2 V
Lead Channel Setting Pacing Amplitude: 2.5 V
Lead Channel Setting Pacing Pulse Width: 0.5 ms
Lead Channel Setting Pacing Pulse Width: 0.8 ms
Lead Channel Setting Sensing Sensitivity: 0.5 mV
Pulse Gen Serial Number: 111057717
Zone Setting Status: 755011

## 2024-02-12 ENCOUNTER — Encounter: Payer: Self-pay | Admitting: Cardiology

## 2024-03-06 NOTE — Progress Notes (Deleted)
  Electrophysiology Office Follow up Visit Note:    Date:  03/06/2024   ID:  Isabella Sanders, DOB 15-Oct-1980, MRN [id number]  PCP:  Lawrance Presume, MD  [facility] Cardiologist:  Oneil Bigness, MD  [facility] Electrophysiologist:  Boyce Byes, MD    Interval History:     Isabella Sanders is a 43 y.o. female who presents for a follow up visit.   I last saw the patient Feb 17, 2022.  She had a CRT-D implanted November 09, 2021.  She has a history of chronic combined systolic and diastolic heart failure secondary to nonischemic cardiomyopathy.  Echo following CRT-D implant showed normalization of her ejection fraction.  Device interrogations have shown stable device function.       Past medical, surgical, social and family history were reviewed.  ROS:   Please see the history of present illness.    All other systems reviewed and are negative.  EKGs/Labs/Other Studies Reviewed:    The following studies were reviewed today:          Physical Exam:    VS:  There were no vitals taken for this visit.    Wt Readings from Last 3 Encounters:  08/03/23 190 lb (86.2 kg)  02/17/22 202 lb 9.6 oz (91.9 kg)  11/09/21 201 lb (91.2 kg)     GEN: no distress CARD: RRR, No MRG.  CIED pocket well-healed RESP: No IWOB. CTAB.      ASSESSMENT:    No diagnosis found. PLAN:    In order of problems listed above:  #Chronic systolic heart failure #Left bundle branch block #CRT-D in situ Device functioning appropriately.  NYHA class II.  Warm and dry on exam.  EF normalized on 2023 echo. Continue spironolactone , Entresto , Lasix , Jardiance  and Coreg .  Follow-up with EP APP in 1 year.   Signed, Harvie Liner, MD, [facility], [facility] 03/06/2024 10:02 PM    Electrophysiology [facility]

## 2024-03-07 ENCOUNTER — Ambulatory Visit: Payer: Self-pay | Attending: Cardiology | Admitting: Cardiology

## 2024-03-07 DIAGNOSIS — I5040 Unspecified combined systolic (congestive) and diastolic (congestive) heart failure: Secondary | ICD-10-CM

## 2024-03-07 DIAGNOSIS — Z95810 Presence of automatic (implantable) cardiac defibrillator: Secondary | ICD-10-CM

## 2024-03-07 DIAGNOSIS — I428 Other cardiomyopathies: Secondary | ICD-10-CM

## 2024-03-08 ENCOUNTER — Encounter: Payer: Self-pay | Admitting: Cardiology

## 2024-03-23 NOTE — Progress Notes (Signed)
 Remote ICD transmission.

## 2024-05-08 ENCOUNTER — Ambulatory Visit: Payer: Self-pay

## 2024-05-08 DIAGNOSIS — I428 Other cardiomyopathies: Secondary | ICD-10-CM

## 2024-05-08 LAB — CUP PACEART REMOTE DEVICE CHECK
Battery Remaining Longevity: 55 mo
Battery Remaining Percentage: 64 %
Battery Voltage: 2.98 V
Brady Statistic AP VP Percent: 11 %
Brady Statistic AP VS Percent: 1 %
Brady Statistic AS VP Percent: 89 %
Brady Statistic AS VS Percent: 1 %
Brady Statistic RA Percent Paced: 11 %
Date Time Interrogation Session: 20250729020331
HighPow Impedance: 60 Ohm
Implantable Lead Connection Status: 753985
Implantable Lead Connection Status: 753985
Implantable Lead Connection Status: 753985
Implantable Lead Implant Date: 20230130
Implantable Lead Implant Date: 20230130
Implantable Lead Implant Date: 20230130
Implantable Lead Location: 753858
Implantable Lead Location: 753859
Implantable Lead Location: 753860
Implantable Pulse Generator Implant Date: 20230130
Lead Channel Impedance Value: 440 Ohm
Lead Channel Impedance Value: 500 Ohm
Lead Channel Impedance Value: 500 Ohm
Lead Channel Pacing Threshold Amplitude: 0.5 V
Lead Channel Pacing Threshold Amplitude: 0.875 V
Lead Channel Pacing Threshold Amplitude: 1.5 V
Lead Channel Pacing Threshold Pulse Width: 0.5 ms
Lead Channel Pacing Threshold Pulse Width: 0.5 ms
Lead Channel Pacing Threshold Pulse Width: 0.8 ms
Lead Channel Sensing Intrinsic Amplitude: 1.3 mV
Lead Channel Sensing Intrinsic Amplitude: 12 mV
Lead Channel Setting Pacing Amplitude: 1.875
Lead Channel Setting Pacing Amplitude: 2 V
Lead Channel Setting Pacing Amplitude: 2.5 V
Lead Channel Setting Pacing Pulse Width: 0.5 ms
Lead Channel Setting Pacing Pulse Width: 0.8 ms
Lead Channel Setting Sensing Sensitivity: 0.5 mV
Pulse Gen Serial Number: 111057717
Zone Setting Status: 755011

## 2024-05-10 ENCOUNTER — Ambulatory Visit: Payer: Self-pay | Admitting: Cardiology

## 2024-07-11 NOTE — Progress Notes (Signed)
 Remote ICD Transmission

## 2024-08-07 ENCOUNTER — Ambulatory Visit (INDEPENDENT_AMBULATORY_CARE_PROVIDER_SITE_OTHER): Payer: Self-pay

## 2024-08-07 DIAGNOSIS — I428 Other cardiomyopathies: Secondary | ICD-10-CM

## 2024-08-07 LAB — CUP PACEART REMOTE DEVICE CHECK
Battery Remaining Longevity: 52 mo
Battery Remaining Percentage: 61 %
Battery Voltage: 2.98 V
Brady Statistic AP VP Percent: 11 %
Brady Statistic AP VS Percent: 1 %
Brady Statistic AS VP Percent: 89 %
Brady Statistic AS VS Percent: 1 %
Brady Statistic RA Percent Paced: 11 %
Date Time Interrogation Session: 20251028020617
HighPow Impedance: 59 Ohm
Implantable Lead Connection Status: 753985
Implantable Lead Connection Status: 753985
Implantable Lead Connection Status: 753985
Implantable Lead Implant Date: 20230130
Implantable Lead Implant Date: 20230130
Implantable Lead Implant Date: 20230130
Implantable Lead Location: 753858
Implantable Lead Location: 753859
Implantable Lead Location: 753860
Implantable Pulse Generator Implant Date: 20230130
Lead Channel Impedance Value: 440 Ohm
Lead Channel Impedance Value: 500 Ohm
Lead Channel Impedance Value: 500 Ohm
Lead Channel Pacing Threshold Amplitude: 0.5 V
Lead Channel Pacing Threshold Amplitude: 0.75 V
Lead Channel Pacing Threshold Amplitude: 1.875 V
Lead Channel Pacing Threshold Pulse Width: 0.5 ms
Lead Channel Pacing Threshold Pulse Width: 0.5 ms
Lead Channel Pacing Threshold Pulse Width: 0.8 ms
Lead Channel Sensing Intrinsic Amplitude: 1.5 mV
Lead Channel Sensing Intrinsic Amplitude: 12 mV
Lead Channel Setting Pacing Amplitude: 1.75 V
Lead Channel Setting Pacing Amplitude: 2 V
Lead Channel Setting Pacing Amplitude: 2.5 V
Lead Channel Setting Pacing Pulse Width: 0.5 ms
Lead Channel Setting Pacing Pulse Width: 0.8 ms
Lead Channel Setting Sensing Sensitivity: 0.5 mV
Pulse Gen Serial Number: 111057717
Zone Setting Status: 755011

## 2024-08-08 ENCOUNTER — Ambulatory Visit: Payer: Self-pay | Admitting: Cardiology

## 2024-08-14 NOTE — Progress Notes (Signed)
 Remote ICD Transmission

## 2024-09-09 NOTE — Progress Notes (Deleted)
  Electrophysiology Office Follow up Visit Note:    Date:  09/09/2024   ID:  Isabella Sanders, DOB [birth year], MRN [id number]  PCP:  Vicci Barnie NOVAK, MD  [facility] Cardiologist:  Darryle ONEIDA Decent, MD  [facility] Electrophysiologist:  OLE ONEIDA HOLTS, MD    Interval History:     Isabella Sanders is a 43 y.o. female who presents for a follow up visit.   I last saw the patient Feb 17, 2022.  She has a CRT-D that was implanted in January 2023.  She has a history of chronic combined systolic and diastolic heart failure, left bundle branch block and a CRT-D in situ.  After CRT-D implant, her EF normalized.        Past medical, surgical, social and family history were reviewed.  ROS:   Please see the history of present illness.    All other systems reviewed and are negative.  EKGs/Labs/Other Studies Reviewed:    The following studies were reviewed today:  September 10, 2024 in-clinic device interrogation personally reviewed ***        Physical Exam:    VS:  There were no vitals taken for this visit.    Wt Readings from Last 3 Encounters:  08/03/23 190 lb (86.2 kg)  02/17/22 202 lb 9.6 oz (91.9 kg)  11/09/21 201 lb (91.2 kg)     GEN: no distress CARD: RRR, No MRG.  Generator pocket well-healed RESP: No IWOB. CTAB.      ASSESSMENT:    No diagnosis found. PLAN:    In order of problems listed above:  #Chronic combined systolic and diastolic heart failure #CRT-D in situ NYHA class II.  Last EF 55% in May 2023.  Warm and dry on exam. Continue spironolactone , Entresto , Lasix , Jardiance , Coreg .  Device functioning appropriately.  Continue remote monitoring.  I discussed my upcoming departure from [facility] during today's clinic appointment.  The patient will continue to follow-up with one of my EP partners moving forward.  Follow-up 1 year with the EP APP.   Signed, Ole Holts, MD, [facility], [facility] 09/09/2024 10:11 AM    Electrophysiology [facility]

## 2024-09-10 ENCOUNTER — Ambulatory Visit: Payer: Self-pay | Attending: Cardiology | Admitting: Cardiology

## 2024-09-10 DIAGNOSIS — I428 Other cardiomyopathies: Secondary | ICD-10-CM

## 2024-09-10 DIAGNOSIS — I5040 Unspecified combined systolic (congestive) and diastolic (congestive) heart failure: Secondary | ICD-10-CM

## 2024-09-10 DIAGNOSIS — Z95810 Presence of automatic (implantable) cardiac defibrillator: Secondary | ICD-10-CM

## 2024-11-06 ENCOUNTER — Ambulatory Visit: Payer: Self-pay

## 2024-11-06 DIAGNOSIS — I428 Other cardiomyopathies: Secondary | ICD-10-CM

## 2024-11-07 LAB — CUP PACEART REMOTE DEVICE CHECK
Battery Remaining Longevity: 46 mo
Battery Remaining Percentage: 57 %
Battery Voltage: 2.96 V
Brady Statistic AP VP Percent: 11 %
Brady Statistic AP VS Percent: 1 %
Brady Statistic AS VP Percent: 89 %
Brady Statistic AS VS Percent: 1 %
Brady Statistic RA Percent Paced: 11 %
Date Time Interrogation Session: 20260127024812
HighPow Impedance: 50 Ohm
Implantable Lead Connection Status: 753985
Implantable Lead Connection Status: 753985
Implantable Lead Connection Status: 753985
Implantable Lead Implant Date: 20230130
Implantable Lead Implant Date: 20230130
Implantable Lead Implant Date: 20230130
Implantable Lead Location: 753858
Implantable Lead Location: 753859
Implantable Lead Location: 753860
Implantable Pulse Generator Implant Date: 20230130
Lead Channel Impedance Value: 380 Ohm
Lead Channel Impedance Value: 390 Ohm
Lead Channel Impedance Value: 410 Ohm
Lead Channel Pacing Threshold Amplitude: 0.5 V
Lead Channel Pacing Threshold Amplitude: 0.75 V
Lead Channel Pacing Threshold Amplitude: 1.875 V
Lead Channel Pacing Threshold Pulse Width: 0.5 ms
Lead Channel Pacing Threshold Pulse Width: 0.5 ms
Lead Channel Pacing Threshold Pulse Width: 0.8 ms
Lead Channel Sensing Intrinsic Amplitude: 1.1 mV
Lead Channel Sensing Intrinsic Amplitude: 12 mV
Lead Channel Setting Pacing Amplitude: 1.75 V
Lead Channel Setting Pacing Amplitude: 2 V
Lead Channel Setting Pacing Amplitude: 2.5 V
Lead Channel Setting Pacing Pulse Width: 0.5 ms
Lead Channel Setting Pacing Pulse Width: 0.8 ms
Lead Channel Setting Sensing Sensitivity: 0.5 mV
Pulse Gen Serial Number: 111057717
Zone Setting Status: 755011

## 2024-11-08 ENCOUNTER — Ambulatory Visit: Payer: Self-pay | Admitting: Cardiology

## 2024-11-14 NOTE — Progress Notes (Signed)
 Remote ICD Transmission
